# Patient Record
Sex: Female | Born: 1975 | Race: White | Hispanic: No | Marital: Married | Smoking: Never smoker
Health system: Southern US, Community
[De-identification: ages and names within clinical notes are randomized; demographics above are authoritative.]

## PROBLEM LIST (undated history)

## (undated) DIAGNOSIS — Z806 Family history of leukemia: Secondary | ICD-10-CM

## (undated) DIAGNOSIS — L729 Follicular cyst of the skin and subcutaneous tissue, unspecified: Secondary | ICD-10-CM

## (undated) DIAGNOSIS — Z85528 Personal history of other malignant neoplasm of kidney: Secondary | ICD-10-CM

## (undated) DIAGNOSIS — C801 Malignant (primary) neoplasm, unspecified: Secondary | ICD-10-CM

## (undated) DIAGNOSIS — R002 Palpitations: Secondary | ICD-10-CM

## (undated) DIAGNOSIS — Z8742 Personal history of other diseases of the female genital tract: Secondary | ICD-10-CM

## (undated) DIAGNOSIS — K219 Gastro-esophageal reflux disease without esophagitis: Secondary | ICD-10-CM

## (undated) DIAGNOSIS — D649 Anemia, unspecified: Secondary | ICD-10-CM

## (undated) DIAGNOSIS — E039 Hypothyroidism, unspecified: Secondary | ICD-10-CM

## (undated) DIAGNOSIS — Z800 Family history of malignant neoplasm of digestive organs: Secondary | ICD-10-CM

## (undated) DIAGNOSIS — R000 Tachycardia, unspecified: Secondary | ICD-10-CM

## (undated) DIAGNOSIS — B379 Candidiasis, unspecified: Secondary | ICD-10-CM

## (undated) DIAGNOSIS — Z9889 Other specified postprocedural states: Secondary | ICD-10-CM

## (undated) DIAGNOSIS — O09529 Supervision of elderly multigravida, unspecified trimester: Secondary | ICD-10-CM

## (undated) DIAGNOSIS — R112 Nausea with vomiting, unspecified: Secondary | ICD-10-CM

## (undated) HISTORY — PX: ABDOMINAL HYSTERECTOMY: SHX81

## (undated) HISTORY — DX: Supervision of elderly multigravida, unspecified trimester: O09.529

## (undated) HISTORY — PX: COLONOSCOPY: SHX174

## (undated) HISTORY — DX: Personal history of other malignant neoplasm of kidney: Z85.528

## (undated) HISTORY — DX: Family history of leukemia: Z80.6

## (undated) HISTORY — DX: Family history of malignant neoplasm of digestive organs: Z80.0

## (undated) HISTORY — PX: RENAL MASS EXCISION: SHX2324

## (undated) HISTORY — DX: Malignant (primary) neoplasm, unspecified: C80.1

---

## 1999-08-31 ENCOUNTER — Other Ambulatory Visit: Admission: RE | Admit: 1999-08-31 | Discharge: 1999-08-31 | Payer: Self-pay | Admitting: Obstetrics and Gynecology

## 1999-10-18 ENCOUNTER — Encounter: Payer: Self-pay | Admitting: Obstetrics and Gynecology

## 1999-10-18 ENCOUNTER — Ambulatory Visit (HOSPITAL_COMMUNITY): Admission: RE | Admit: 1999-10-18 | Discharge: 1999-10-18 | Payer: Self-pay | Admitting: Obstetrics and Gynecology

## 1999-11-19 ENCOUNTER — Ambulatory Visit (HOSPITAL_COMMUNITY): Admission: RE | Admit: 1999-11-19 | Discharge: 1999-11-19 | Payer: Self-pay | Admitting: Obstetrics and Gynecology

## 1999-11-19 ENCOUNTER — Encounter: Payer: Self-pay | Admitting: Obstetrics and Gynecology

## 2000-02-22 ENCOUNTER — Ambulatory Visit (HOSPITAL_COMMUNITY): Admission: RE | Admit: 2000-02-22 | Discharge: 2000-02-22 | Payer: Self-pay | Admitting: Obstetrics and Gynecology

## 2000-02-22 ENCOUNTER — Encounter: Payer: Self-pay | Admitting: Obstetrics and Gynecology

## 2000-03-13 ENCOUNTER — Inpatient Hospital Stay (HOSPITAL_COMMUNITY): Admission: AD | Admit: 2000-03-13 | Discharge: 2000-03-13 | Payer: Self-pay | Admitting: Obstetrics and Gynecology

## 2000-03-19 ENCOUNTER — Inpatient Hospital Stay (HOSPITAL_COMMUNITY): Admission: AD | Admit: 2000-03-19 | Discharge: 2000-03-21 | Payer: Self-pay | Admitting: Obstetrics and Gynecology

## 2000-06-23 ENCOUNTER — Emergency Department (HOSPITAL_COMMUNITY): Admission: EM | Admit: 2000-06-23 | Discharge: 2000-06-24 | Payer: Self-pay | Admitting: Emergency Medicine

## 2000-08-20 ENCOUNTER — Other Ambulatory Visit: Admission: RE | Admit: 2000-08-20 | Discharge: 2000-08-20 | Payer: Self-pay | Admitting: Obstetrics and Gynecology

## 2002-09-22 ENCOUNTER — Other Ambulatory Visit: Admission: RE | Admit: 2002-09-22 | Discharge: 2002-09-22 | Payer: Self-pay | Admitting: Obstetrics and Gynecology

## 2004-02-02 ENCOUNTER — Other Ambulatory Visit: Admission: RE | Admit: 2004-02-02 | Discharge: 2004-02-02 | Payer: Self-pay | Admitting: Obstetrics and Gynecology

## 2004-08-13 ENCOUNTER — Inpatient Hospital Stay (HOSPITAL_COMMUNITY): Admission: AD | Admit: 2004-08-13 | Discharge: 2004-08-13 | Payer: Self-pay | Admitting: Obstetrics and Gynecology

## 2005-02-27 ENCOUNTER — Emergency Department (HOSPITAL_COMMUNITY): Admission: EM | Admit: 2005-02-27 | Discharge: 2005-02-27 | Payer: Self-pay | Admitting: Emergency Medicine

## 2005-03-05 ENCOUNTER — Ambulatory Visit (HOSPITAL_COMMUNITY): Admission: RE | Admit: 2005-03-05 | Discharge: 2005-03-05 | Payer: Self-pay | Admitting: Internal Medicine

## 2005-12-22 ENCOUNTER — Encounter: Admission: RE | Admit: 2005-12-22 | Discharge: 2005-12-22 | Payer: Self-pay | Admitting: Family Medicine

## 2007-03-06 ENCOUNTER — Encounter: Admission: RE | Admit: 2007-03-06 | Discharge: 2007-03-06 | Payer: Self-pay | Admitting: Internal Medicine

## 2007-05-19 ENCOUNTER — Other Ambulatory Visit: Admission: RE | Admit: 2007-05-19 | Discharge: 2007-05-19 | Payer: Self-pay | Admitting: Interventional Radiology

## 2007-05-19 ENCOUNTER — Encounter (INDEPENDENT_AMBULATORY_CARE_PROVIDER_SITE_OTHER): Payer: Self-pay | Admitting: Interventional Radiology

## 2007-05-19 ENCOUNTER — Encounter: Admission: RE | Admit: 2007-05-19 | Discharge: 2007-05-19 | Payer: Self-pay | Admitting: Internal Medicine

## 2008-06-30 ENCOUNTER — Ambulatory Visit (HOSPITAL_COMMUNITY): Admission: RE | Admit: 2008-06-30 | Discharge: 2008-06-30 | Payer: Self-pay | Admitting: Obstetrics and Gynecology

## 2008-07-28 ENCOUNTER — Ambulatory Visit (HOSPITAL_COMMUNITY): Admission: RE | Admit: 2008-07-28 | Discharge: 2008-07-28 | Payer: Self-pay | Admitting: Obstetrics and Gynecology

## 2008-09-16 ENCOUNTER — Ambulatory Visit (HOSPITAL_COMMUNITY): Admission: RE | Admit: 2008-09-16 | Discharge: 2008-09-16 | Payer: Self-pay | Admitting: Obstetrics and Gynecology

## 2008-10-07 ENCOUNTER — Inpatient Hospital Stay (HOSPITAL_COMMUNITY): Admission: AD | Admit: 2008-10-07 | Discharge: 2008-10-10 | Payer: Self-pay | Admitting: Obstetrics and Gynecology

## 2008-10-07 ENCOUNTER — Encounter (INDEPENDENT_AMBULATORY_CARE_PROVIDER_SITE_OTHER): Payer: Self-pay | Admitting: Obstetrics and Gynecology

## 2011-01-06 ENCOUNTER — Encounter: Payer: Self-pay | Admitting: Internal Medicine

## 2011-04-30 NOTE — Discharge Summary (Signed)
NAMEZENDA, HERSKOWITZ                ACCOUNT NO.:  [id number]   MEDICAL RECORD NO.:  [id number]          PATIENT TYPE:  INP   LOCATION:  9304                          FACILITY:  [facility]   PHYSICIAN:  Sherron Monday, MD        DATE OF BIRTH:  12-26-1975   DATE OF ADMISSION:  10/07/2008  DATE OF DISCHARGE:  10/10/2008                               DISCHARGE SUMMARY   ADMITTING DIAGNOSES:  Intrauterine pregnancy at term, nonreassuring  fetal heart tracing.   DISCHARGE DIAGNOSES:  Intrauterine pregnancy at term, nonreassuring  fetal heart tracing, delivered via primary low transverse cesarean  section.   HISTORY OF PRESENT ILLNESS:  A 35 year old G2, P1-0-0-1 at 70 plus weeks  with decreased fetal movement, no loss of fluids, no vaginal bleeding,  and occasional contractions.  She initially had an NST in the office  that was somewhat nonreassuring.  She was taken to MAU where she had an  nonreassuring tracing there as well with repetitive late decelerations.  I discussed with the patient risks, benefits, and alternatives and the  decision was made to proceed with a primary low transverse cesarean  section.  The patient states she has had increased blood pressure, but  normal PIH labs and a 24-hour urine revealed 273 mg of protein,  otherwise uncomplicated prenatal care.   PAST MEDICAL HISTORY:  Significant for Hashimoto thyroiditis as well as  sinus tachycardia with normal echo.   PAST SURGICAL HISTORY:  Nonsignificant.   PAST OB/GYN HISTORY:  G1 is a term vaginal delivery of a female infant  weighing 7 pounds 6 ounces.  She is now 69-year-old and has type 1  diabetes.  G2 is the present pregnancy female infant, his name is Education administrator.  No history of any abnormal Pap smears or sexually transmitted diseases.   MEDICATIONS:  Prenatal vitamins, Synthroid, and Inderal.   ALLERGIES:  PENICILLIN, which causes rash.   SOCIAL HISTORY:  She denies alcohol, tobacco, or drug use.  She is  married and  works for hospice.   FAMILY HISTORY:  Significant for mother has passed away from colon  cancer.  Father with coronary artery disease, diabetes, and end-stage  renal disease.   PRENATAL LABS:  Hemoglobin 12.0 and platelets 271.  A positive.  Antibody screen negative.  Gonorrhea negative.  Chlamydia negative.  RPR  nonreactive.  Rubella immune.  Hepatitis C surface antigen negative.  HIV negative.  Glucola of 121.  Group B strep was negative.  First  trimester screen within normal limits.  AFB within normal limits.  Ultrasound recently revealed an AFI of 10, vertex presentation.  Estimated fetal weight of 2813 g or 6 pounds 3 ounces __________.   PHYSICAL EXAMINATION:  She is afebrile.  Vital signs are stable on  admission.  Blood pressure was 131/89, benign exam with the heart  tracing as above mentioned 140s, minimal variabilities, some  decelerations which on further monitoring were repetitive late  decelerations.  The patient did have a scope exam after her vaginal  exam.  Contractions were difficult to trace and it seemed to be  every 5-  10 minutes.  Vaginal exam was 3-4 cm dilated, 30% effaced, and -3  station.  She was admitted and proceeded with a low transverse cesarean  section after discussing risks, benefits, and alternatives.  Her surgery  went without difficulty, delivering a viable female infant at 14:43 with  Apgars of 7 at 1 minute and 9 at 5 minutes and weight of 5 pounds 14  ounces.  Placenta was expressed, intact at 1444, normal uterus, tubes,  and ovaries with an EBL of 700 mL.  Her postoperative course was  relatively uncomplicated.  She remained afebrile and vital signs stable  throughout.  Her hemoglobin decreased from 11.8 to 9.3.  She was  discharged to home on postoperative day #3 with routine discharge  instructions and numbers to call with any questions or problems.  At  this point, she had normal lochia, pain was well controlled.  She was  ambulating and  voiding without difficulty.  Her incision was clean, dry,  and intact.  She was discharged to home with routine discharge  instructions and numbers to call with any questions or problems as well  as prescriptions for Motrin, Vicodin, and prenatal vitamins.  She will  come to the office on Friday for a staple removal.  She is A positive.  Rubella immune.  She plans to get an Essure tubal ligation for  contraception in the postpartum course.  She is breastfeeding.  Hemoglobin decreased from 11.8 to 9.2.      Sherron Monday, MD  Electronically Signed     JB/MEDQ  D:  10/10/2008  T:  10/10/2008  Job:  [id number]

## 2011-04-30 NOTE — Op Note (Signed)
Kathleen Henry, Kathleen Henry                ACCOUNT NO.:  [id number]   MEDICAL RECORD NO.:  [id number]          PATIENT TYPE:  INP   LOCATION:  9304                          FACILITY:  [facility]   PHYSICIAN:  Sherron Monday, MD        DATE OF BIRTH:  [birth year]   DATE OF PROCEDURE:  10/07/2008  DATE OF DISCHARGE:                               OPERATIVE REPORT   PREOPERATIVE DIAGNOSES:  Intrauterine pregnancy at term, fetal stress,  fetal heart tones 140, minimal variability with repetitive late  decelerations.   POSTOPERATIVE DIAGNOSES:  Intrauterine pregnancy at term, fetal stress,  fetal heart tones 140, minimal variability with repetitive late  decelerations, delivered.   PROCEDURE:  Primary low-transverse cesarean section.   SURGEON:  Sherron Monday, MD.   ANESTHESIA:  Spinal.   FINDINGS:  Viable female infant at 14:43 with Apgars of 7 at 1 minute and  9 at 5 minutes and a weight of 5 pounds 14 ounces.  Placenta was  expressed intact at 14:44.  Normal uterus, tubes, and ovaries noted.   PATHOLOGY:  Placenta.   COMPLICATIONS:  None.   EBL:  700 mL.   IV FLUIDS:  1100 mL.   URINE OUTPUT:  300 mL.   DISPOSITION:  Stable to PACU following the procedure.   DESCRIPTION OF PROCEDURE:  After informed consent was reviewed with the  patient including risks, benefits, and alternatives of surgical  procedure and given her nonreassuring fetal heart tracing with  repetitive decelerations, she was transferred to the operating room.  Spinal anesthesia was placed.  She was then placed in supine position on  the table with a leftward tilt, prepped and draped in the normal sterile  fashion.  A Pfannenstiel skin incision was made at the level  approximately 2 fingerbreadths above pubic symphysis and carried through  to the underlying layer of fascia sharply.  The fascia was incised in  the midline.  The incision was extended laterally with Mayo scissors.  Superior aspect of the fascial incision was  grasped with Kocher clamps  and elevated.  Rectus muscles were dissected off both bluntly and  sharply.  Inferior aspect of the fascial incision was grasped with  Kocher clamps and elevated.  The rectus muscles were dissected off  bluntly.  Peritoneum was entered bluntly.  The Alexis wound retractor  was placed.  It was carefully checked to make sure no bowel was  entrapped.  The vesicouterine peritoneum was identified, tented up with  smooth pickups and the bladder flap was created with Metzenbaum scissors  and blunt dissection.  The uterus was incised in transverse fashion.  Infant was delivered from the vertex presentation and cord was clamped  and cut.  Mouth and nose were suctioned.  The infant was handed off to  the awaiting pediatric staff.  Cord gases were collected and sent  however an adequate for sample.  The placenta was inspected intact.  Uterus was cleared of all clots and debris.  The uterine incision was  closed in 2 layers of 0 Monocryl, first which was a running locked and  the second was an imbricating layer.  The pelvis was irrigated and  subfascial planes were inspected and found to be hemostatic.  The fascia  was closed with 0 Vicryl in a running fashion with subcuticular adipose  layer was made hemostatic with Bovie cautery was irrigated and  reapproximated using 2-0 plain gut sutures.  The skin was closed with  staples.  The patient tolerated the procedure well.  Sponge, lap, and  needle counts were correct x2 at the end of procedure per the operating  room staff.      Sherron Monday, MD  Electronically Signed     JB/MEDQ  D:  10/07/2008  T:  10/08/2008  Job:  [id number]

## 2011-05-03 NOTE — Discharge Summary (Signed)
[facility]  Patient:    Kathleen Henry, Kathleen Henry                         MRN: [id number] Adm. Date:  [id number] Disc. Date: [id number] Attending:  Oliver Pila                           Discharge Summary  00000DISCHARGE DIAGNOSES: 1. Term pregnancy at 40+ weeks delivered. 2. Status post normal spontaneous vaginal delivery.  DISCHARGE MEDICATIONS: Motrin 600 mg p.o. every 6 hours.  DISCHARGE FOLLOWUP:  Patient is to follow up in my office in approximately six weeks for her routine postpartum exam.  HOSPITAL COURSE:  Patient is a 35 year old G1,P0 who was admitted at 69 5/7 weeks for induction of labor given she is postdate favorable.  Pregnancy was uncomplicated except for a fundal height greater than date; however, a sonogram on 02/22/00 revealed a fetus that was 39th percentile and 2,881 grams.  Prenatal labs were as follows:  A+ antibody negative, RPR nonreactive, rubella ______, hepatitis B surface antigen negative, HIV negative, GC negative, chlamydia negative, and GPS negative.  PAST OB HISTORY:  None.  PAST GYN HISTORY:  None.  PAST MEDICAL HISTORY:  None.  PAST SURGICAL HISTORY:  None.  ALLERGIES: PENICILLIN.  MEDICATIONS:  Prenatal vitamins.  On admission, she was afebrile with stable vital signs.  Fundal height was 44 cm and ST was reactive.  Cervix was 2 cm dilated 50% effaced ______ station.  She ad assist ruptured membranes with clear fluid obtained and was begun on ______ augmentation. Patient progressed well and had a normal spontaneous vaginal delivery of a vigorous female infant over a second degree laceration.  Apgars were 8/9, weight was 7 pounds, 6 ounces.  Her second degree laceration was repaired with -0 and 2-0 Vicryl.  The placenta delivered spontaneously.  The EBL was 350cc.  She was admitted for routine postpartum care and did well.  She was discharged to home n postpartum day #2, afebrile with stable vital  signs and had normal lochia and her pain was well controlled with Motrin. DD:  03/21/00 TD:  03/23/00 Job: [id number]

## 2011-09-17 LAB — COMPREHENSIVE METABOLIC PANEL
ALT: 9
Albumin: 2.5 — ABNORMAL LOW
Alkaline Phosphatase: 122 — ABNORMAL HIGH
BUN: 7
Chloride: 103
Glucose, Bld: 67 — ABNORMAL LOW
Potassium: 4.5
Sodium: 133 — ABNORMAL LOW
Total Bilirubin: 1.1
Total Protein: 5.8 — ABNORMAL LOW

## 2011-09-17 LAB — CBC
HCT: 35.2 — ABNORMAL LOW
Hemoglobin: 9.3 — ABNORMAL LOW
MCHC: 33.5
MCHC: 34
MCV: 86.4
Platelets: 301
RBC: 3.17 — ABNORMAL LOW
RDW: 14.5
RDW: 15.1

## 2011-09-17 LAB — LACTATE DEHYDROGENASE: LDH: 239

## 2011-09-17 LAB — RPR: RPR Ser Ql: NONREACTIVE

## 2012-12-16 DIAGNOSIS — L729 Follicular cyst of the skin and subcutaneous tissue, unspecified: Secondary | ICD-10-CM

## 2012-12-16 HISTORY — DX: Follicular cyst of the skin and subcutaneous tissue, unspecified: L72.9

## 2014-11-08 LAB — OB RESULTS CONSOLE GC/CHLAMYDIA
Chlamydia: NEGATIVE
Gonorrhea: NEGATIVE

## 2014-11-08 LAB — OB RESULTS CONSOLE ABO/RH: RH TYPE: POSITIVE

## 2014-11-08 LAB — OB RESULTS CONSOLE HIV ANTIBODY (ROUTINE TESTING): HIV: NONREACTIVE

## 2014-11-08 LAB — OB RESULTS CONSOLE RPR: RPR: NONREACTIVE

## 2014-11-08 LAB — OB RESULTS CONSOLE RUBELLA ANTIBODY, IGM: Rubella: UNDETERMINED

## 2014-11-08 LAB — OB RESULTS CONSOLE HEPATITIS B SURFACE ANTIGEN: Hepatitis B Surface Ag: NEGATIVE

## 2014-11-08 LAB — OB RESULTS CONSOLE ANTIBODY SCREEN: ANTIBODY SCREEN: NEGATIVE

## 2014-12-16 NOTE — L&D Delivery Note (Signed)
VBAC  Delivery Note  SVD viable SVD Apgars 9,9 over 2nd degree ML lac with nuchal x 1 reduced.  Placenta delivered spontaneously intact with 3VC. Repair with 2-0 Chromic with good support and hemostasis noted and R/V exam confirms.  PH art was not done.  [place] cord blood was done.  Mother and baby were doing well.  EBL 100c  Louretta Shorten, MD

## 2014-12-27 ENCOUNTER — Other Ambulatory Visit (HOSPITAL_COMMUNITY): Payer: Self-pay | Admitting: Obstetrics and Gynecology

## 2014-12-27 DIAGNOSIS — Z0489 Encounter for examination and observation for other specified reasons: Secondary | ICD-10-CM

## 2014-12-27 DIAGNOSIS — O09522 Supervision of elderly multigravida, second trimester: Secondary | ICD-10-CM

## 2014-12-27 DIAGNOSIS — IMO0002 Reserved for concepts with insufficient information to code with codable children: Secondary | ICD-10-CM

## 2015-01-17 ENCOUNTER — Ambulatory Visit (HOSPITAL_COMMUNITY): Payer: Self-pay

## 2015-01-17 ENCOUNTER — Other Ambulatory Visit (HOSPITAL_COMMUNITY): Payer: Self-pay

## 2015-01-17 ENCOUNTER — Ambulatory Visit (HOSPITAL_COMMUNITY)
Admission: RE | Admit: 2015-01-17 | Discharge: 2015-01-17 | Disposition: A | Discharge status: Home / Self Care | Payer: 59 | Source: Ambulatory Visit | Attending: Obstetrics and Gynecology | Admitting: Obstetrics and Gynecology

## 2015-01-17 ENCOUNTER — Encounter (HOSPITAL_COMMUNITY): Payer: Self-pay

## 2015-01-17 ENCOUNTER — Other Ambulatory Visit (HOSPITAL_COMMUNITY): Payer: Self-pay | Admitting: Obstetrics and Gynecology

## 2015-01-17 DIAGNOSIS — IMO0002 Reserved for concepts with insufficient information to code with codable children: Secondary | ICD-10-CM

## 2015-01-17 DIAGNOSIS — O09522 Supervision of elderly multigravida, second trimester: Secondary | ICD-10-CM | POA: Insufficient documentation

## 2015-01-17 DIAGNOSIS — O99212 Obesity complicating pregnancy, second trimester: Secondary | ICD-10-CM | POA: Insufficient documentation

## 2015-01-17 DIAGNOSIS — Z3A19 19 weeks gestation of pregnancy: Secondary | ICD-10-CM | POA: Insufficient documentation

## 2015-01-17 DIAGNOSIS — Z315 Encounter for genetic counseling: Secondary | ICD-10-CM | POA: Diagnosis present

## 2015-01-17 DIAGNOSIS — O09529 Supervision of elderly multigravida, unspecified trimester: Secondary | ICD-10-CM | POA: Insufficient documentation

## 2015-01-17 DIAGNOSIS — Z0489 Encounter for examination and observation for other specified reasons: Secondary | ICD-10-CM

## 2015-01-17 DIAGNOSIS — Z3689 Encounter for other specified antenatal screening: Secondary | ICD-10-CM | POA: Insufficient documentation

## 2015-01-17 HISTORY — DX: Tachycardia, unspecified: R00.0

## 2015-01-17 HISTORY — DX: Hypothyroidism, unspecified: E03.9

## 2015-01-17 NOTE — Progress Notes (Signed)
Genetic Counseling  High-Risk Gestation Note  Appointment Date:  01/17/2015 Referred By: Kathleen Lex, MD Date of Birth:  [birth year] Partner:  Melba Henry   Pregnancy History: G3P2000 Estimated Date of Delivery: 06/12/15 Estimated Gestational Age: [redacted]w[redacted]d Attending: Renella Cunas, MD    Kathleen Henry and her husband, Kathleen Henry, were seen for genetic counseling because of a maternal age of 39 y.o.Marland Kitchen     They were counseled regarding maternal age and the association with risk for chromosome conditions due to nondisjunction with aging of the ova.  We reviewed chromosomes, nondisjunction, and the associated 1 in 48 risk for fetal aneuploidy related to a maternal age of 39 y.o. at [redacted]w[redacted]d gestation.  They were counseled that the risk for aneuploidy decreases as gestational age increases, accounting for those pregnancies which spontaneously abort.  We specifically discussed Down syndrome (trisomy 78), trisomies 32 and 58, and sex chromosome aneuploidies (47,XXX and 47,XXY) including the common features and prognoses of each.   Kathleen Henry previously had noninvasive prenatal screening (NIPS)/cell free DNA (cfDNA) testing (Panorama through [place]) drawn twice through her OB office in the first trimester. Both attempts yielded no results due to low fetal fraction in the maternal sample.   We reviewed available screening options for fetal aneuploidy including Quad screen, noninvasive prenatal screening (NIPS)/cell free DNA (cfDNA) testing, and detailed ultrasound.  They were counseled that screening tests are used to modify a patient's a priori risk for aneuploidy, typically based on age. This estimate provides a pregnancy specific risk assessment. We reviewed the benefits and limitations of each option. Specifically, we discussed the conditions for which each test screens, the detection rates, and false positive rates of each. We discussed that fetal fraction is expected to increase incrementally  throughout pregnancy and thus, a redraw may be able to yield a result if that fetal fraction has adequately increased. We discussed that based upon available information from [facility] laboratory, the likelihood of obtaining a result is estimated to be 63% in this case, which is based upon various factors including maternal weight and amount of fetal fraction in the previous sample. We reviewed that there are some pregnancies that do not have a sufficient amount of fetal fraction for NIPS to be performed, regardless of the gestational age, and we are not able to accurately predict which pregnancies may fall in this category. We also discussed that literature suggests an association with increased risk for aneuploidy in pregnancies with low fetal fraction. However, data are currently too limited to accurately adjust the pregnancy specific risk for aneuploidy in these cases.   Targeted ultrasound was performed today. We reviewed that visualized fetal anatomy appeared normal. However, limited views of fetal anatomy were obtained. Follow-up ultrasound was planned in four weeks to complete fetal anatomic survey. Complete ultrasound results reported separately. They were also counseled regarding diagnostic testing via amniocentesis. We reviewed the approximate 1 in 097-353 risk for complications for amniocentesis, including spontaneous pregnancy loss. After careful consideration, Kathleen Henry elected to proceed with a redraw for Panorama today and declined Quad screen. Kathleen Henry declined amniocentesis in the pregnancy. They understand that screening tests cannot rule out all birth defects or genetic syndromes. The patient was advised of this limitation and states she still does not want additional testing at this time.   Kathleen Henry was provided with written information regarding cystic fibrosis (CF) including the carrier frequency and incidence in the Caucasian population, the availability of carrier testing and  prenatal diagnosis  if indicated. Kathleen Henry reported a "third or fourth cousin" with cystic fibrosis. He was unsure of the exact degree of relation. We discussed that in the case of a third cousin with CF, the degree of relation would not increase his risk to be a CF carrier above the general population risk.  In addition, we discussed that CF is routinely screened for as part of the [place] newborn screening panel.  She declined testing today.   Both family histories were reviewed and found to be contributory for type I diabetes mellitus for the couple's daughter. She is currently 57 years old and diagnosed at age 70 years old.  Kathleen Henry also reported a maternal uncle and a maternal great-uncle with type I diabetes. Diabetes mellitus type I is thought to be due to multifactorial inheritance, with onset due to a combination genetic and environmental factors. The chance for type I diabetes in siblings of an isolated case is approximately 5%. They understand that prenatal screening or testing is not available for diabetes mellitus in the baby at this time.  It would be important for the couple's pediatrician to be aware of this family history so that their children can be followed and screened appropriately.  Kathleen Henry reported a female paternal first cousin with spina bifida. She reported that he does not have effects from this and to her knowledge, has not required surgery.  She reported that he possibly has closed spina bifida. Spina bifida occulta, or closed spina bifida, often describes an absence of one or two vertebral arches, without a visible lesion, which may be discovered incidentally following an X-ray for backache or other unrelated symptoms. Some forms of spina bifida occulta can cause symptoms, such as the presence of a tethered cord. Limited information exists regarding the prevalence and underlying cause of spina bifida occulta. Studies suggest that in cases of asymptomatic spina bifida occulta,  recurrence risk to relatives is not likely increased. In the case of symptomatic spina bifida occulta, recurrence risk for spina bifida may be similar to that of open neural tube defects. Open neural tube defects occur as an isolated finding, in the majority of cases and are typically suspected to follow multifactorial inheritance. Open neural tube defects may also occur as a feature of an underlying genetic syndrome or condition.  We discussed that the reported family history is more likely consistent with a form of closed spina bifida, in which case recurrence risk would not be expected to be increased above the general population risk for the current pregnancy. We discussed that prenatal screening does not assess for closed spina bifida. In the case of a form of open spina bifida, recurrence risk for the current pregnancy would also not likely be increased above the general population risk given the degree of relation and in the case of multifactorial inheritance. Additionally, we reviewed that Ms. Henry previously had maternal serum AFP screening performed, which was within normal range.   Kathleen Henry also reported a female maternal first cousin once removed (her paternal aunt's son's son) with intellectual disability. He was described to have dysmorphic features, specifically his head shape. He is currently 41 years old. A specific underlying cause is not known for his features by the couple. The couple were counseled that there are many different causes of intellectual disabilities including environmental, multifactorial, and genetic etiologies.  We discussed that a specific diagnosis for intellectual disability can be determined in approximately 50% of these individuals.  In the remaining 50% of individuals,  a diagnosis may never be determined.  Regarding genetic causes, we discussed that chromosome aberrations (aneuploidy, deletions, duplications, insertions, and translocations) are responsible for a small  percentage of individuals with intellectual disability.  Many individuals with chromosome aberrations have additional differences, including congenital anomalies or minor dysmorphisms.  Likewise, single gene conditions are the underlying cause of intellectual delay in some families.  We discussed that many gene conditions have intellectual disability as a feature, but also often include other physical or medical differences. We discussed that without more specific information, it is difficult to provide an accurate risk assessment.  Further genetic counseling is warranted if more information is obtained.  Kathleen Henry reported a female paternal first cousin with cerebral palsy.  Cerebral palsy (CP) is a group of clinical syndromes that range in severity, characterized by abnormal muscle tone, posture, and movement. Cerebral palsy is due to abnormalities in the developing brain resulting from a variety of causes. The etiology is reported to be multifactorial, with most cases typically due to prenatal factors. Prematurity is the most common association, but in many cases, no cause is identified. A specific genetic cause for cerebral palsy has not been identified, and underlying genetic disorders are relatively uncommon in individuals with CP.  Given the reported family history, recurrence risk for the current pregnancy would not likely be increased. Additional information regarding this individual's underlying condition or etiology may alter recurrence risk assessment. Without further information regarding the provided family history, an accurate genetic risk cannot be calculated. Further genetic counseling is warranted if more information is obtained.  Kathleen Henry denied exposure to environmental toxins or chemical agents. She denied the use of alcohol, tobacco or street drugs. She denied significant viral illnesses during the course of her pregnancy. Her medical and surgical histories were contributory for  sinus tachycardia and Hashimoto's thyroiditis, and she is taking medication regarding this history.   I counseled this couple regarding the above risks and available options.  The approximate face-to-face time with the genetic counselor was 45 minutes.  Kathleen Oman, MS,  Certified Genetic Counselor 01/17/2015

## 2015-01-24 ENCOUNTER — Telehealth (HOSPITAL_COMMUNITY): Payer: Self-pay | Admitting: MS"

## 2015-01-24 NOTE — Telephone Encounter (Signed)
Left message for patient to return call.   Santiago Glad Aurther Harlin 01/24/2015 9:19 AM

## 2015-01-24 NOTE — Telephone Encounter (Signed)
Called Kathleen Henry to discuss her cell free fetal DNA test results.  Kathleen Henry had Panorama testing through [facility].  Testing was offered because of advanced maternal age.   The patient was identified by name and DOB.  We reviewed that these are within normal limits, showing a less than 1 in 10,000 risk for trisomies 21, 18 and 13, and monosomy Henry (Turner syndrome).  In addition, the risk for triploidy/vanishing twin and sex chromosome trisomies (47,XXX and 47,XXY) was also low risk.  Kathleen Henry elected to have cffDNA analysis for 22q11 deletion syndrome, which was also low risk (<1 in 3330).  We reviewed that this testing identifies > 99% of pregnancies with trisomy 3, trisomy 10, sex chromosome trisomies (47,XXX and 47,XXY), and triploidy. The detection rate for trisomy 18 is 96%.  The detection rate for monosomy Henry is ~92%.  The false positive rate is <0.1% for all conditions. Testing was also consistent with female fetal sex.  The patient did wish to know fetal sex.  She understands that this testing does not identify all genetic conditions.  All questions were answered to her satisfaction, she was encouraged to call with additional questions or concerns.  Chipper Oman, MS Certified Genetic Counselor 01/24/2015 9:25 AM

## 2015-01-25 ENCOUNTER — Other Ambulatory Visit (HOSPITAL_COMMUNITY): Payer: Self-pay

## 2015-02-14 ENCOUNTER — Other Ambulatory Visit (HOSPITAL_COMMUNITY): Payer: Self-pay | Admitting: Obstetrics and Gynecology

## 2015-02-14 ENCOUNTER — Other Ambulatory Visit (HOSPITAL_COMMUNITY): Payer: Self-pay | Admitting: Maternal and Fetal Medicine

## 2015-02-14 ENCOUNTER — Ambulatory Visit (HOSPITAL_COMMUNITY)
Admission: RE | Admit: 2015-02-14 | Discharge: 2015-02-14 | Disposition: A | Discharge status: Home / Self Care | Payer: 59 | Source: Ambulatory Visit | Attending: Obstetrics and Gynecology | Admitting: Obstetrics and Gynecology

## 2015-02-14 ENCOUNTER — Encounter (HOSPITAL_COMMUNITY): Payer: Self-pay

## 2015-02-14 DIAGNOSIS — Z0489 Encounter for examination and observation for other specified reasons: Secondary | ICD-10-CM | POA: Insufficient documentation

## 2015-02-14 DIAGNOSIS — O99210 Obesity complicating pregnancy, unspecified trimester: Secondary | ICD-10-CM

## 2015-02-14 DIAGNOSIS — Z3A23 23 weeks gestation of pregnancy: Secondary | ICD-10-CM | POA: Diagnosis not present

## 2015-02-14 DIAGNOSIS — O09522 Supervision of elderly multigravida, second trimester: Secondary | ICD-10-CM

## 2015-02-14 DIAGNOSIS — O99282 Endocrine, nutritional and metabolic diseases complicating pregnancy, second trimester: Secondary | ICD-10-CM | POA: Insufficient documentation

## 2015-02-14 DIAGNOSIS — O3421 Maternal care for scar from previous cesarean delivery: Secondary | ICD-10-CM | POA: Diagnosis not present

## 2015-02-14 DIAGNOSIS — O99212 Obesity complicating pregnancy, second trimester: Secondary | ICD-10-CM | POA: Diagnosis not present

## 2015-02-14 DIAGNOSIS — E039 Hypothyroidism, unspecified: Secondary | ICD-10-CM | POA: Diagnosis not present

## 2015-02-14 DIAGNOSIS — IMO0002 Reserved for concepts with insufficient information to code with codable children: Secondary | ICD-10-CM

## 2015-02-14 DIAGNOSIS — Z36 Encounter for antenatal screening of mother: Secondary | ICD-10-CM | POA: Insufficient documentation

## 2015-03-29 ENCOUNTER — Ambulatory Visit (HOSPITAL_COMMUNITY): Payer: 59

## 2015-06-08 ENCOUNTER — Encounter (HOSPITAL_COMMUNITY): Payer: Self-pay | Admitting: Anesthesiology

## 2015-06-08 NOTE — Anesthesia Preprocedure Evaluation (Deleted)
Anesthesia Evaluation  Patient identified by MRN, date of birth, ID band Patient awake    Reviewed: Allergy & Precautions, NPO status , Patient's Chart, lab work & pertinent test results, reviewed documented beta blocker date and time   Airway        Dental   Pulmonary neg pulmonary ROS,          Cardiovascular negative cardio ROS      Neuro/Psych negative neurological ROS     GI/Hepatic   Endo/Other  Hypothyroidism (replacement) Morbid obesity  Renal/GU      Musculoskeletal   Abdominal   Peds  Hematology   Anesthesia Other Findings   Reproductive/Obstetrics                            Anesthesia Physical Anesthesia Plan  ASA: II  Anesthesia Plan: Spinal   Post-op Pain Management:    Induction:   Airway Management Planned: Natural Airway  Additional Equipment:   Intra-op Plan:   Post-operative Plan:   Informed Consent: I have reviewed the patients History and Physical, chart, labs and discussed the procedure including the risks, benefits and alternatives for the proposed anesthesia with the patient or authorized representative who has indicated his/her understanding and acceptance.     Plan Discussed with:   Anesthesia Plan Comments: (Check am labs)        Anesthesia Quick Evaluation

## 2015-06-09 ENCOUNTER — Telehealth (HOSPITAL_COMMUNITY): Payer: Self-pay | Admitting: *Deleted

## 2015-06-09 ENCOUNTER — Encounter (HOSPITAL_COMMUNITY): Payer: Self-pay | Admitting: *Deleted

## 2015-06-09 LAB — OB RESULTS CONSOLE GBS: STREP GROUP B AG: POSITIVE

## 2015-06-09 NOTE — Telephone Encounter (Signed)
Preadmission screen  

## 2015-06-13 ENCOUNTER — Inpatient Hospital Stay (HOSPITAL_COMMUNITY): Admission: RE | Admit: 2015-06-13 | Payer: 59 | Source: Ambulatory Visit

## 2015-06-14 ENCOUNTER — Inpatient Hospital Stay (HOSPITAL_COMMUNITY): Payer: 59 | Admitting: Anesthesiology

## 2015-06-14 ENCOUNTER — Inpatient Hospital Stay (HOSPITAL_COMMUNITY)
Admission: RE | Admit: 2015-06-14 | Discharge: 2015-06-16 | DRG: 775 | Disposition: A | Discharge status: Home / Self Care | Payer: 59 | Source: Ambulatory Visit | Attending: Obstetrics and Gynecology | Admitting: Obstetrics and Gynecology

## 2015-06-14 ENCOUNTER — Inpatient Hospital Stay (HOSPITAL_COMMUNITY)
Admission: AD | Admit: 2015-06-14 | Disposition: A | Discharge status: Home / Self Care | Payer: 59 | Source: Ambulatory Visit | Admitting: Obstetrics and Gynecology

## 2015-06-14 ENCOUNTER — Encounter (HOSPITAL_COMMUNITY): Payer: Self-pay

## 2015-06-14 DIAGNOSIS — O09523 Supervision of elderly multigravida, third trimester: Secondary | ICD-10-CM

## 2015-06-14 DIAGNOSIS — O99824 Streptococcus B carrier state complicating childbirth: Secondary | ICD-10-CM

## 2015-06-14 DIAGNOSIS — O99214 Obesity complicating childbirth: Secondary | ICD-10-CM

## 2015-06-14 DIAGNOSIS — Z8249 Family history of ischemic heart disease and other diseases of the circulatory system: Secondary | ICD-10-CM

## 2015-06-14 DIAGNOSIS — Z3A40 40 weeks gestation of pregnancy: Secondary | ICD-10-CM | POA: Diagnosis present

## 2015-06-14 DIAGNOSIS — O480 Post-term pregnancy: Principal | ICD-10-CM | POA: Diagnosis present

## 2015-06-14 DIAGNOSIS — Z6841 Body Mass Index (BMI) 40.0 and over, adult: Secondary | ICD-10-CM

## 2015-06-14 DIAGNOSIS — O3421 Maternal care for scar from previous cesarean delivery: Secondary | ICD-10-CM | POA: Diagnosis present

## 2015-06-14 DIAGNOSIS — Z3490 Encounter for supervision of normal pregnancy, unspecified, unspecified trimester: Secondary | ICD-10-CM

## 2015-06-14 HISTORY — DX: Personal history of other diseases of the female genital tract: Z87.42

## 2015-06-14 HISTORY — DX: Follicular cyst of the skin and subcutaneous tissue, unspecified: L72.9

## 2015-06-14 LAB — TYPE AND SCREEN
ABO/RH(D): A POS
Antibody Screen: NEGATIVE

## 2015-06-14 LAB — CBC
HEMATOCRIT: 34 % — AB (ref 36.0–46.0)
HEMOGLOBIN: 11.4 g/dL — AB (ref 12.0–15.0)
MCH: 28.6 pg (ref 26.0–34.0)
MCHC: 33.5 g/dL (ref 30.0–36.0)
MCV: 85.2 fL (ref 78.0–100.0)
Platelets: 247 10*3/uL (ref 150–400)
RBC: 3.99 MIL/uL (ref 3.87–5.11)
RDW: 14.7 % (ref 11.5–15.5)
WBC: 8.5 10*3/uL (ref 4.0–10.5)

## 2015-06-14 LAB — ABO/RH: ABO/RH(D): A POS

## 2015-06-14 LAB — RPR: RPR Ser Ql: NONREACTIVE

## 2015-06-14 SURGERY — Surgical Case
Anesthesia: Regional

## 2015-06-14 MED ORDER — SENNOSIDES-DOCUSATE SODIUM 8.6-50 MG PO TABS
2.0000 | ORAL_TABLET | ORAL | Status: DC
  Administered 2015-06-14 – 2015-06-16 (×2): 2 via ORAL
  Filled 2015-06-14 (×2): qty 2

## 2015-06-14 MED ORDER — OXYTOCIN 40 UNITS IN LACTATED RINGERS INFUSION - SIMPLE MED
1.0000 m[IU]/min | INTRAVENOUS | Status: DC
  Administered 2015-06-14: 4 m[IU]/min via INTRAVENOUS
  Administered 2015-06-14: 1 m[IU]/min via INTRAVENOUS
  Filled 2015-06-14: qty 1000

## 2015-06-14 MED ORDER — DIBUCAINE 1 % RE OINT: 1.0000 "application " | TOPICAL_OINTMENT | RECTAL | Status: DC | PRN

## 2015-06-14 MED ORDER — LIDOCAINE HCL (PF) 1 % IJ SOLN
30.0000 mL | INTRAMUSCULAR | Status: DC | PRN
  Filled 2015-06-14: qty 30

## 2015-06-14 MED ORDER — FLEET ENEMA 7-19 GM/118ML RE ENEM: 1.0000 | ENEMA | RECTAL | Status: DC | PRN

## 2015-06-14 MED ORDER — PROPRANOLOL HCL 40 MG PO TABS: 50.0000 mg | ORAL_TABLET | Freq: Four times a day (QID) | ORAL | Status: DC

## 2015-06-14 MED ORDER — PHENYLEPHRINE 40 MCG/ML (10ML) SYRINGE FOR IV PUSH (FOR BLOOD PRESSURE SUPPORT)
PREFILLED_SYRINGE | INTRAVENOUS | Status: AC
  Filled 2015-06-14: qty 20

## 2015-06-14 MED ORDER — DIPHENHYDRAMINE HCL 50 MG/ML IJ SOLN: 12.5000 mg | INTRAMUSCULAR | Status: DC | PRN

## 2015-06-14 MED ORDER — BUTORPHANOL TARTRATE 1 MG/ML IJ SOLN: 1.0000 mg | INTRAMUSCULAR | Status: DC | PRN

## 2015-06-14 MED ORDER — OXYCODONE-ACETAMINOPHEN 5-325 MG PO TABS
1.0000 | ORAL_TABLET | ORAL | Status: DC | PRN
Start: 2015-06-14 — End: 2015-06-14

## 2015-06-14 MED ORDER — OXYTOCIN 40 UNITS IN LACTATED RINGERS INFUSION - SIMPLE MED: 62.5000 mL/h | INTRAVENOUS | Status: DC

## 2015-06-14 MED ORDER — WITCH HAZEL-GLYCERIN EX PADS: 1.0000 "application " | MEDICATED_PAD | CUTANEOUS | Status: DC | PRN

## 2015-06-14 MED ORDER — LACTATED RINGERS IV SOLN
INTRAVENOUS | Status: DC
  Administered 2015-06-14 (×2): via INTRAVENOUS

## 2015-06-14 MED ORDER — OXYCODONE-ACETAMINOPHEN 5-325 MG PO TABS: 2.0000 | ORAL_TABLET | ORAL | Status: DC | PRN

## 2015-06-14 MED ORDER — ACETAMINOPHEN 325 MG PO TABS: 650.0000 mg | ORAL_TABLET | ORAL | Status: DC | PRN

## 2015-06-14 MED ORDER — PROPRANOLOL HCL 10 MG PO TABS
10.0000 mg | ORAL_TABLET | Freq: Two times a day (BID) | ORAL | Status: DC
  Filled 2015-06-14 (×2): qty 1

## 2015-06-14 MED ORDER — PHENYLEPHRINE 40 MCG/ML (10ML) SYRINGE FOR IV PUSH (FOR BLOOD PRESSURE SUPPORT): 80.0000 ug | PREFILLED_SYRINGE | INTRAVENOUS | Status: DC | PRN

## 2015-06-14 MED ORDER — ZOLPIDEM TARTRATE 5 MG PO TABS: 5.0000 mg | ORAL_TABLET | Freq: Every evening | ORAL | Status: DC | PRN

## 2015-06-14 MED ORDER — FENTANYL 2.5 MCG/ML BUPIVACAINE 1/10 % EPIDURAL INFUSION (WH - ANES)
14.0000 mL/h | INTRAMUSCULAR | Status: DC | PRN
  Administered 2015-06-14: 14 mL/h via EPIDURAL

## 2015-06-14 MED ORDER — PROPRANOLOL HCL 20 MG/5ML PO SOLN
10.0000 mg | Freq: Two times a day (BID) | ORAL | Status: DC
  Administered 2015-06-14: 10 mg via ORAL
  Filled 2015-06-14 (×3): qty 2.5

## 2015-06-14 MED ORDER — BENZOCAINE-MENTHOL 20-0.5 % EX AERO
1.0000 "application " | INHALATION_SPRAY | CUTANEOUS | Status: DC | PRN
  Administered 2015-06-14: 1 via TOPICAL
  Filled 2015-06-14 (×2): qty 56

## 2015-06-14 MED ORDER — MEDROXYPROGESTERONE ACETATE 150 MG/ML IM SUSP
150.0000 mg | INTRAMUSCULAR | Status: DC | PRN
Start: 2015-06-14 — End: 2015-06-16

## 2015-06-14 MED ORDER — PROPRANOLOL HCL 10 MG PO TABS
10.0000 mg | ORAL_TABLET | Freq: Two times a day (BID) | ORAL | Status: DC
  Filled 2015-06-14: qty 1

## 2015-06-14 MED ORDER — CITRIC ACID-SODIUM CITRATE 334-500 MG/5ML PO SOLN: 30.0000 mL | ORAL | Status: DC | PRN

## 2015-06-14 MED ORDER — PRENATAL MULTIVITAMIN CH
1.0000 | ORAL_TABLET | Freq: Every day | ORAL | Status: DC
  Administered 2015-06-15: 1 via ORAL
  Filled 2015-06-14: qty 1

## 2015-06-14 MED ORDER — MEASLES, MUMPS & RUBELLA VAC ~~LOC~~ INJ: 0.5000 mL | INJECTION | Freq: Once | SUBCUTANEOUS | Status: DC

## 2015-06-14 MED ORDER — OXYCODONE-ACETAMINOPHEN 5-325 MG PO TABS: 1.0000 | ORAL_TABLET | ORAL | Status: DC | PRN

## 2015-06-14 MED ORDER — LEVOTHYROXINE SODIUM 150 MCG PO TABS
150.0000 ug | ORAL_TABLET | Freq: Every day | ORAL | Status: DC
  Administered 2015-06-15 – 2015-06-16 (×2): 150 ug via ORAL
  Filled 2015-06-14 (×2): qty 1

## 2015-06-14 MED ORDER — LEVOTHYROXINE SODIUM 150 MCG PO TABS
150.0000 ug | ORAL_TABLET | Freq: Every day | ORAL | Status: DC
  Administered 2015-06-14: 150 ug via ORAL
  Filled 2015-06-14 (×2): qty 1

## 2015-06-14 MED ORDER — ACETAMINOPHEN 325 MG PO TABS
650.0000 mg | ORAL_TABLET | ORAL | Status: DC | PRN
Start: 2015-06-14 — End: 2015-06-14

## 2015-06-14 MED ORDER — FENTANYL 2.5 MCG/ML BUPIVACAINE 1/10 % EPIDURAL INFUSION (WH - ANES)
INTRAMUSCULAR | Status: AC
  Filled 2015-06-14: qty 125

## 2015-06-14 MED ORDER — SIMETHICONE 80 MG PO CHEW: 80.0000 mg | CHEWABLE_TABLET | ORAL | Status: DC | PRN

## 2015-06-14 MED ORDER — CLINDAMYCIN PHOSPHATE 900 MG/50ML IV SOLN
900.0000 mg | Freq: Three times a day (TID) | INTRAVENOUS | Status: DC
  Administered 2015-06-14 (×2): 900 mg via INTRAVENOUS
  Filled 2015-06-14 (×3): qty 50

## 2015-06-14 MED ORDER — IBUPROFEN 600 MG PO TABS
600.0000 mg | ORAL_TABLET | Freq: Four times a day (QID) | ORAL | Status: DC
  Administered 2015-06-14 – 2015-06-16 (×7): 600 mg via ORAL
  Filled 2015-06-14 (×7): qty 1

## 2015-06-14 MED ORDER — OXYTOCIN BOLUS FROM INFUSION: 500.0000 mL | INTRAVENOUS | Status: DC

## 2015-06-14 MED ORDER — ONDANSETRON HCL 4 MG/2ML IJ SOLN: 4.0000 mg | INTRAMUSCULAR | Status: DC | PRN

## 2015-06-14 MED ORDER — ONDANSETRON HCL 4 MG/2ML IJ SOLN: 4.0000 mg | Freq: Four times a day (QID) | INTRAMUSCULAR | Status: DC | PRN

## 2015-06-14 MED ORDER — FENTANYL 2.5 MCG/ML BUPIVACAINE 1/10 % EPIDURAL INFUSION (WH - ANES)
INTRAMUSCULAR | Status: DC | PRN
  Administered 2015-06-14: 14 mL/h via EPIDURAL

## 2015-06-14 MED ORDER — LIDOCAINE HCL (PF) 1 % IJ SOLN
INTRAMUSCULAR | Status: DC | PRN
  Administered 2015-06-14: 10 mL

## 2015-06-14 MED ORDER — EPHEDRINE 5 MG/ML INJ: 10.0000 mg | INTRAVENOUS | Status: DC | PRN

## 2015-06-14 MED ORDER — PROPRANOLOL HCL 10 MG PO TABS
5.0000 mg | ORAL_TABLET | Freq: Four times a day (QID) | ORAL | Status: DC
  Administered 2015-06-14 (×2): 5 mg via ORAL
  Administered 2015-06-15: 06:00:00 via ORAL
  Administered 2015-06-15: 5 mg via ORAL
  Filled 2015-06-14 (×8): qty 0.5

## 2015-06-14 MED ORDER — ONDANSETRON HCL 4 MG PO TABS: 4.0000 mg | ORAL_TABLET | ORAL | Status: DC | PRN

## 2015-06-14 MED ORDER — LACTATED RINGERS IV SOLN: 500.0000 mL | INTRAVENOUS | Status: DC | PRN

## 2015-06-14 MED ORDER — ZOLPIDEM TARTRATE 5 MG PO TABS
5.0000 mg | ORAL_TABLET | Freq: Every evening | ORAL | Status: DC | PRN
Start: 2015-06-14 — End: 2015-06-14

## 2015-06-14 MED ORDER — LANOLIN HYDROUS EX OINT
TOPICAL_OINTMENT | CUTANEOUS | Status: DC | PRN
Start: 2015-06-14 — End: 2015-06-16

## 2015-06-14 MED ORDER — TETANUS-DIPHTH-ACELL PERTUSSIS 5-2.5-18.5 LF-MCG/0.5 IM SUSP: 0.5000 mL | Freq: Once | INTRAMUSCULAR | Status: DC

## 2015-06-14 MED ORDER — TERBUTALINE SULFATE 1 MG/ML IJ SOLN: 0.2500 mg | Freq: Once | INTRAMUSCULAR | Status: DC | PRN

## 2015-06-14 MED ORDER — DIPHENHYDRAMINE HCL 25 MG PO CAPS: 25.0000 mg | ORAL_CAPSULE | Freq: Four times a day (QID) | ORAL | Status: DC | PRN

## 2015-06-14 NOTE — Anesthesia Preprocedure Evaluation (Signed)
Anesthesia Evaluation  Patient identified by MRN, date of birth, ID band Patient awake and Patient confused    Reviewed: Allergy & Precautions, H&P , NPO status , Patient's Chart, lab work & pertinent test results  Airway Mallampati: II   Neck ROM: full    Dental   Pulmonary  breath sounds clear to auscultation  Pulmonary exam normal       Cardiovascular Exercise Tolerance: Good Normal cardiovascular examRhythm:regular Rate:Normal     Neuro/Psych    GI/Hepatic   Endo/Other  Hypothyroidism Morbid obesity  Renal/GU      Musculoskeletal   Abdominal   Peds  Hematology   Anesthesia Other Findings   Reproductive/Obstetrics (+) Pregnancy                             Anesthesia Physical Anesthesia Plan  ASA: III  Anesthesia Plan:    Post-op Pain Management:    Induction:   Airway Management Planned:   Additional Equipment:   Intra-op Plan:   Post-operative Plan:   Informed Consent: I have reviewed the patients History and Physical, chart, labs and discussed the procedure including the risks, benefits and alternatives for the proposed anesthesia with the patient or authorized representative who has indicated his/her understanding and acceptance.     Plan Discussed with:   Anesthesia Plan Comments:         Anesthesia Quick Evaluation

## 2015-06-14 NOTE — Anesthesia Procedure Notes (Signed)
Epidural Patient location during procedure: OB Start time: 06/14/2015 9:02 AM End time: 06/14/2015 9:20 AM  Staffing Anesthesiologist: Alexis Frock  Preanesthetic Checklist Completed: patient identified, site marked, surgical consent, pre-op evaluation, timeout performed, IV checked, risks and benefits discussed and monitors and equipment checked  Epidural Patient position: sitting Prep: site prepped and draped and DuraPrep Patient monitoring: heart rate, continuous pulse ox and blood pressure Approach: midline Location: L2-L3 Injection technique: LOR air  Needle:  Needle type: Tuohy  Needle gauge: 18 G Needle length: 9 cm and 9 Needle insertion depth: 8 cm Catheter type: closed end flexible Catheter size: 19 Gauge Catheter at skin depth: 15 cm Test dose: negative  Assessment Events: blood not aspirated, injection not painful, no injection resistance, negative IV test and no paresthesia  Additional Notes   Patient tolerated the insertion well without complications.Reason for block:procedure for pain

## 2015-06-14 NOTE — H&P (Signed)
Kathleen Henry is a 39 y.o. female presenting for IOL due to postdates, prev C/S desires VBAC.  GBS +.  AMA with normal level II [place] and NIPT. History OB History    Gravida Para Term Preterm AB TAB SAB Ectopic Multiple Living   3 2 2       2      Past Medical History  Diagnosis Date  . Hypothyroidism   . Sinus tachycardia   . AMA (advanced maternal age) multigravida 35+    Past Surgical History  Procedure Laterality Date  . Cesarean section     Family History: family history includes CAD in her father; Cancer in her mother, paternal grandfather, and paternal grandmother; Diabetes in her daughter and father; Hypertension in her father and mother; Kidney disease in her father; Thyroid disease in her father. Social History:  reports that she has never smoked. She has never used smokeless tobacco. She reports that she does not drink alcohol or use illicit drugs.   Prenatal Transfer Tool  Maternal Diabetes: No Genetic Screening: Normal Maternal Ultrasounds/Referrals: Normal Fetal Ultrasounds or other Referrals:  None Maternal Substance Abuse:  No Significant Maternal Medications:  None Significant Maternal Lab Results:  None Other Comments:  None  ROS  Dilation: 2 Effacement (%): 50 Station: -2 Exam by:: Kathleen Igo, RN Blood pressure 135/87, pulse 91, temperature 99.1 F (37.3 C), temperature source Oral, resp. rate 18, height 5\' 7"  (1.702 m), weight 350 lb (158.759 kg), last menstrual period 09/05/2014. Exam Physical Exam  Prenatal labs: ABO, Rh: --/--/A POS (06/29 0450) Antibody: NEG (06/29 0450) Rubella: Equivocal (11/24 0000) RPR: Nonreactive (11/24 0000)  HBsAg: Negative (11/24 0000)  HIV: Non-reactive (11/24 0000)  GBS: Positive (06/24 0000)   Assessment/Plan: IUP at 40 + weeks Desires IOL v C/S and risks and benefits of VBAC/TOL discussed informed consent obtained. Abx for GBS    Kathleen Henry C 06/14/2015, 8:06 AM

## 2015-06-15 ENCOUNTER — Encounter (HOSPITAL_COMMUNITY): Payer: Self-pay

## 2015-06-15 LAB — CCBB MATERNAL DONOR DRAW

## 2015-06-15 LAB — CBC
HEMATOCRIT: 32.4 % — AB (ref 36.0–46.0)
Hemoglobin: 10.7 g/dL — ABNORMAL LOW (ref 12.0–15.0)
MCH: 28.8 pg (ref 26.0–34.0)
MCHC: 33 g/dL (ref 30.0–36.0)
MCV: 87.1 fL (ref 78.0–100.0)
PLATELETS: 262 10*3/uL (ref 150–400)
RBC: 3.72 MIL/uL — AB (ref 3.87–5.11)
RDW: 15.1 % (ref 11.5–15.5)
WBC: 10.2 10*3/uL (ref 4.0–10.5)

## 2015-06-15 NOTE — Progress Notes (Addendum)
Spoke with Dr. Lynnette Caffey regarding pts BP of 96/68 when she normally runs upper 130s/90s.  Pt states that she "has felt weird over the last hour, but not dizzy."  States she felt a clot come out and I assessed her to find a grapefruit size clot.  Upon fundal check uterus was firm at umbilicus with no bleeding upon massage.  Dr. Lynnette Caffey instructed for pt to push fluids and recheck BP in an hour. Orders received to hold 1800 dose of propranolol 5 mg.

## 2015-06-15 NOTE — Anesthesia Postprocedure Evaluation (Signed)
  Anesthesia Post-op Note  Patient: Kathleen Henry  Procedure(s) Performed: * No procedures listed *  Patient Location: Mother/Baby  Anesthesia Type:Epidural  Level of Consciousness: awake  Airway and Oxygen Therapy: Patient Spontanous Breathing  Post-op Pain: mild  Post-op Assessment: Patient's Cardiovascular Status Stable and Respiratory Function Stable              Post-op Vital Signs: stable  Last Vitals:  Filed Vitals:   06/15/15 0615  BP: 140/93  Pulse: 93  Temp: 36.7 C  Resp: 20    Complications: No apparent anesthesia complications

## 2015-06-15 NOTE — Lactation Note (Signed)
This note was copied from the chart of [place]. Lactation Consultation Note; Mom has baby latched to the breast when I went into room. Needed some assistance with positioning baby and getting in a more comfortable position for herself. Nursed for a total of 30 min on both breasts. Mom reports she was having some trouble getting latched to left breast but baby latched well and some swallows noted. BF brochure given with resources for support after DC. Reviewed BFSG and OP appointments. Has her own personal Medela pump in room. Reports she has a few times and obtained just drops. Reassurnce given  Encouraged frequent breastfeeding to promote a good milk supply. No questions at present.   Patient Name: Kathleen Henry [id number] Date: 06/15/2015 Reason for consult: Initial assessment   Maternal Data Formula Feeding for Exclusion: No  Feeding Feeding Type: Breast Fed Length of feed: 30 min  LATCH Score/Interventions Latch: Grasps breast easily, tongue down, lips flanged, rhythmical sucking.  Audible Swallowing: A few with stimulation  Type of Nipple: Everted at rest and after stimulation  Comfort (Breast/Nipple): Soft / non-tender     Hold (Positioning): Assistance needed to correctly position infant at breast and maintain latch. Intervention(s): Breastfeeding basics reviewed;Support Pillows;Position options  LATCH Score: 8  Lactation Tools Discussed/Used     Consult Status Consult Status: Follow-up Date: 06/16/15 Follow-up type: In-patient    Truddie Crumble 06/15/2015, 1:38 PM

## 2015-06-15 NOTE — Progress Notes (Signed)
Kathleen Henry was taken to nursery for circ.  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   Defer circ to urology.   Parents are informed and agree.    Linda Hedges, DO

## 2015-06-15 NOTE — Progress Notes (Signed)
Post Partum Day 1 Subjective: no complaints, up ad lib, voiding, tolerating PO, + flatus and denies HA, or RUQ pain. Does take Inderal for sinus tachcardia  Objective: Blood pressure 140/93, pulse 93, temperature 98 F (36.7 C), temperature source Oral, resp. rate 20, height 5\' 7"  (1.702 m), weight 350 lb (158.759 kg), last menstrual period 09/05/2014, unknown if currently breastfeeding.  Physical Exam:  General: alert and cooperative Lochia: appropriate Uterine Fundus: firm Incision: healing well DVT Evaluation: No evidence of DVT seen on physical exam. Negative Homan's sign. No cords or calf tenderness. Calf/Ankle edema is present. DTR's 2+ , no clonus   Recent Labs  06/14/15 0450 06/15/15 0530  HGB 11.4* 10.7*  HCT 34.0* 32.4*    Assessment/Plan: Plan for discharge tomorrow, check CBC, CMP in am Baby with ? hypospadius   LOS: 1 day   CURTIS,CAROL G 06/15/2015, 8:16 AM   Patient wants circ.  Per peds, normal anatomy.  On my exam, centrally located meatus.  Will proceed with circ.  Counseled re: risk of bleeding, infection and scarring.  All questions were answered.

## 2015-06-16 LAB — CBC
HEMATOCRIT: 29.9 % — AB (ref 36.0–46.0)
HEMOGLOBIN: 9.6 g/dL — AB (ref 12.0–15.0)
MCH: 28.3 pg (ref 26.0–34.0)
MCHC: 32.1 g/dL (ref 30.0–36.0)
MCV: 88.2 fL (ref 78.0–100.0)
Platelets: 229 10*3/uL (ref 150–400)
RBC: 3.39 MIL/uL — ABNORMAL LOW (ref 3.87–5.11)
RDW: 15 % (ref 11.5–15.5)
WBC: 9.1 10*3/uL (ref 4.0–10.5)

## 2015-06-16 LAB — COMPREHENSIVE METABOLIC PANEL
ALK PHOS: 92 U/L (ref 38–126)
ALT: 15 U/L (ref 14–54)
ANION GAP: 6 (ref 5–15)
AST: 15 U/L (ref 15–41)
Albumin: 2.4 g/dL — ABNORMAL LOW (ref 3.5–5.0)
BUN: 12 mg/dL (ref 6–20)
CALCIUM: 8.6 mg/dL — AB (ref 8.9–10.3)
CO2: 24 mmol/L (ref 22–32)
Chloride: 108 mmol/L (ref 101–111)
Creatinine, Ser: 0.74 mg/dL (ref 0.44–1.00)
GFR calc Af Amer: >60 mL/min (ref >60)
GFR calc non Af Amer: >60 mL/min (ref >60)
GLUCOSE: 80 mg/dL (ref 65–99)
Potassium: 4.2 mmol/L (ref 3.5–5.1)
Sodium: 138 mmol/L (ref 135–145)
Total Bilirubin: 0.4 mg/dL (ref 0.3–1.2)
Total Protein: 5.5 g/dL — ABNORMAL LOW (ref 6.5–8.1)

## 2015-06-16 MED ORDER — IBUPROFEN 600 MG PO TABS: 600.0000 mg | ORAL_TABLET | Freq: Four times a day (QID) | ORAL | Status: DC

## 2015-06-16 NOTE — Discharge Summary (Signed)
Obstetric Discharge Summary Reason for Admission: induction of labor Prenatal Procedures: ultrasound Intrapartum Procedures: spontaneous vaginal delivery Postpartum Procedures: none Complications-Operative and Postpartum: 2 degree perineal laceration HEMOGLOBIN  Date Value Ref Range Status  06/16/2015 9.6* 12.0 - 15.0 g/dL Final   HCT  Date Value Ref Range Status  06/16/2015 29.9* 36.0 - 46.0 % Final    Physical Exam:  General: alert and cooperative Lochia: appropriate Uterine Fundus: firm Incision: healing well DVT Evaluation: No evidence of DVT seen on physical exam. Negative Homan's sign. No cords or calf tenderness. Calf/Ankle edema is present.  Discharge Diagnoses: Term Pregnancy-delivered  Discharge Information: Date: 06/16/2015 Activity: pelvic rest Diet: routine Medications: PNV, Ibuprofen and inderal and synthroid Condition: stable Instructions: refer to practice specific booklet Discharge to: home   Newborn Data: Live born female  Birth Weight: 7 lb 9.2 oz (3436 g) APGAR: 9, 9  Home with mother.  Ishmael Berkovich G 06/16/2015, 8:25 AM

## 2015-06-16 NOTE — Lactation Note (Signed)
This note was copied from the chart of [place]. Lactation Consultation Note  Patient Name: Kathleen Henry [id number] Date: 06/16/2015 Reason for consult: Follow-up assessment  With this mom of a term baby, now 60 hours old. Mom said breastfeeding was going well. She said her nipples were a little sore. I observed a latch, and advised her to use cross cradle as opposed to cradle. Mom latched the baby easily, and reported this latch feeling deeper. Mom is an experienced breast feeder, and knows sto call for questions/concerns.    Maternal Data    Feeding Feeding Type: Breast Fed Nipple Type: Slow - flow  LATCH Score/Interventions Latch: Grasps breast easily, tongue down, lips flanged, rhythmical sucking.  Audible Swallowing: Spontaneous and intermittent  Type of Nipple: Everted at rest and after stimulation (large nipples)  Comfort (Breast/Nipple): Soft / non-tender     Hold (Positioning): Assistance needed to correctly position infant at breast and maintain latch. Intervention(s): Breastfeeding basics reviewed;Support Pillows;Position options;Skin to skin  LATCH Score: 9  Lactation Tools Discussed/Used     Consult Status Consult Status: Complete Follow-up type: Call as needed    Tonna Corner 06/16/2015, 11:23 AM

## 2015-06-16 NOTE — Progress Notes (Signed)
Post Partum Day 2 Subjective: no complaints, up ad lib, voiding, tolerating PO and + flatus  Objective: Blood pressure 113/61, pulse 88, temperature 97.6 F (36.4 C), temperature source Oral, resp. rate 18, height 5\' 7"  (1.702 m), weight 350 lb (158.759 kg), last menstrual period 09/05/2014, unknown if currently breastfeeding.  Physical Exam:  General: alert and cooperative Lochia: appropriate Uterine Fundus: firm Incision: healing well DVT Evaluation: No evidence of DVT seen on physical exam. Negative Homan's sign. No cords or calf tenderness. Calf/Ankle edema is present.   Recent Labs  06/15/15 0530 06/16/15 0535  HGB 10.7* 9.6*  HCT 32.4* 29.9*    Assessment/Plan: Discharge home   LOS: 2 days   Daleisa Halperin G 06/16/2015, 8:23 AM

## 2015-07-19 ENCOUNTER — Other Ambulatory Visit: Payer: Self-pay | Admitting: Obstetrics and Gynecology

## 2015-07-20 LAB — CYTOLOGY - PAP

## 2016-12-05 ENCOUNTER — Other Ambulatory Visit: Payer: Self-pay | Admitting: Obstetrics and Gynecology

## 2016-12-05 HISTORY — PX: DILATION AND CURETTAGE OF UTERUS: SHX78

## 2016-12-05 HISTORY — PX: ENDOMETRIAL ABLATION: SHX621

## 2016-12-05 HISTORY — PX: TUBAL LIGATION: SHX77

## 2016-12-19 MED FILL — OMEPRAZOLE DR 20 MG CAPSULE: 20 | 90 days supply | Qty: 90 | Fill #0

## 2016-12-19 MED FILL — FLUTICASONE PROP 50 MCG SPR: 50 | 60 days supply | Qty: 16 | Fill #0

## 2016-12-19 MED FILL — LEVOTHYROXINE 125 MCG TAB: 125 | 30 days supply | Qty: 30 | Fill #0

## 2016-12-22 ENCOUNTER — Emergency Department (HOSPITAL_BASED_OUTPATIENT_CLINIC_OR_DEPARTMENT_OTHER)
Admission: EM | Admit: 2016-12-22 | Discharge: 2016-12-22 | Disposition: A | Discharge status: Home / Self Care | Payer: 59 | Attending: Emergency Medicine | Admitting: Emergency Medicine

## 2016-12-22 ENCOUNTER — Encounter (HOSPITAL_BASED_OUTPATIENT_CLINIC_OR_DEPARTMENT_OTHER): Payer: Self-pay | Admitting: Emergency Medicine

## 2016-12-22 DIAGNOSIS — I808 Phlebitis and thrombophlebitis of other sites: Secondary | ICD-10-CM

## 2016-12-22 DIAGNOSIS — M7989 Other specified soft tissue disorders: Secondary | ICD-10-CM | POA: Diagnosis present

## 2016-12-22 DIAGNOSIS — E039 Hypothyroidism, unspecified: Secondary | ICD-10-CM | POA: Insufficient documentation

## 2016-12-22 DIAGNOSIS — I82611 Acute embolism and thrombosis of superficial veins of right upper extremity: Secondary | ICD-10-CM | POA: Insufficient documentation

## 2016-12-22 DIAGNOSIS — Z79899 Other long term (current) drug therapy: Secondary | ICD-10-CM | POA: Insufficient documentation

## 2016-12-22 MED ORDER — ASPIRIN EC 325 MG PO TBEC: 650.0000 mg | DELAYED_RELEASE_TABLET | Freq: Every day | ORAL | 0 refills | Status: AC

## 2016-12-22 NOTE — ED Provider Notes (Signed)
[facility] [facility] Provider Note   CSN: [id number] Arrival date & time: 12/22/16  1112     History   Chief Complaint Chief Complaint  Patient presents with  . Arm Injury    HPI Kathleen Henry is a 41 y.o. female.  The history is provided by the patient.  Extremity Pain  This is a new problem. The current episode started more than 1 week ago. The problem occurs constantly. Progression since onset: moved from hand to volar forearm. Pertinent negatives include no chest pain, no abdominal pain and no shortness of breath. Nothing aggravates the symptoms. Nothing relieves the symptoms. She has tried nothing for the symptoms.    Past Medical History:  Diagnosis Date  . AMA (advanced maternal age) multigravida 34+   . Benign cyst of skin 2014   To neck- removed  . History of PCOS   . Hypothyroidism   . Sinus tachycardia     Patient Active Problem List   Diagnosis Date Noted  . Term pregnancy 06/14/2015  . [redacted] weeks gestation of pregnancy   . Evaluate anatomy not seen on prior sonogram   . Maternal morbid obesity, antepartum ([place])   . Hypothyroidism affecting pregnancy in second trimester, antepartum   . [redacted] weeks gestation of pregnancy   . Advanced maternal age in multigravida   . AMA (advanced maternal age) multigravida 24+   . Encounter for fetal anatomic survey   . Obesity affecting pregnancy in second trimester, antepartum     Past Surgical History:  Procedure Laterality Date  . CESAREAN SECTION      OB History    Gravida Para Term Preterm AB Living   3 3 3     3    SAB TAB Ectopic Multiple Live Births         0 3       Home Medications    Prior to Admission medications   Medication Sig Start Date End Date Taking? Authorizing Provider  ibuprofen (ADVIL,MOTRIN) 600 MG tablet Take 1 tablet (600 mg total) by mouth every 6 (six) hours. 06/16/15  Yes Juanda Chance, NP  levothyroxine (SYNTHROID, LEVOTHROID) 150 MCG tablet Take 150 mcg by mouth daily before  breakfast.   Yes Historical Provider, MD  omeprazole (PRILOSEC) 20 MG capsule Take 20 mg by mouth daily.   Yes Historical Provider, MD  Prenatal Vit-Fe Fumarate-FA (PRENATAL MULTIVITAMIN) TABS tablet Take 1 tablet by mouth daily at 12 noon.   Yes Historical Provider, MD  propranolol (INDERAL) 10 MG tablet Take 0.5 tablets by mouth 4 (four) times daily. 5mg  four times a day. 05/26/14  Yes Historical Provider, MD    Family History Family History  Problem Relation Age of Onset  . Hypertension Mother   . Cancer Mother     colon  . CAD Father   . Hypertension Father   . Diabetes Father     Type 2  . Thyroid disease Father   . Kidney disease Father   . Diabetes Daughter     Type 1  . Cancer Paternal Grandmother     liver  . Cancer Paternal Grandfather     leaukemia    Social History Social History  Substance Use Topics  . Smoking status: Never Smoker  . Smokeless tobacco: Never Used  . Alcohol use No     Allergies   Penicillins   Review of Systems Review of Systems  Respiratory: Negative for shortness of breath.   Cardiovascular: Negative for chest pain.  Gastrointestinal: Negative for abdominal pain.  All other systems reviewed and are negative.    Physical Exam Updated Vital Signs BP 152/98 (BP Location: Left Arm)   Pulse 102   Temp 98.1 F (36.7 C) (Oral)   Resp 22   Ht 5\' 7"  (1.702 m)   Wt 295 lb (133.8 kg)   LMP  (LMP Unknown)   SpO2 100%   BMI 46.20 kg/m   Physical Exam  Constitutional: She is oriented to person, place, and time. She appears well-developed and well-nourished. No distress.  HENT:  Head: Normocephalic.  Nose: Nose normal.  Eyes: Conjunctivae are normal.  Neck: Neck supple. No tracheal deviation present.  Cardiovascular: Normal rate and regular rhythm.   Pulmonary/Chest: Effort normal. No respiratory distress.  Abdominal: Soft. She exhibits no distension.  Musculoskeletal:  Volar right forearm with small palpable tender cord and  minimal edema  Neurological: She is alert and oriented to person, place, and time.  Skin: Skin is warm and dry.  Psychiatric: She has a normal mood and affect.     ED Treatments / Results  Labs (all labs ordered are listed, but only abnormal results are displayed) Labs Reviewed - No data to display  EKG  EKG Interpretation None       Radiology No results found.  Procedures Procedures (including critical care time)  Medications Ordered in ED Medications - No data to display   Initial Impression / Assessment and Plan / ED Course  I have reviewed the triage vital signs and the nursing notes.  Pertinent labs & imaging results that were available during my care of the patient were reviewed by me and considered in my medical decision making (see chart for details).  Clinical Course     41 y.o. female presents with a mild superficial thrombophlebitis of the right volar forearm which initially started as right dorsal hand swelling after IV placement for an outpatient surgical procedure over 2 weeks ago and has since progressed slowly proximally. Discussed supportive care measures including warm compresses and NSAIDs, specifically aspirin for antiplatelet benefit to prevent further propagation.  Final Clinical Impressions(s) / ED Diagnoses   Final diagnoses:  Superficial thrombophlebitis of right upper extremity    New Prescriptions New Prescriptions   ASPIRIN EC 325 MG TABLET    Take 2 tablets (650 mg total) by mouth daily.     Leo Grosser, MD 12/22/16 1226

## 2016-12-22 NOTE — ED Triage Notes (Signed)
States had gyn surgery on 12/05/16 and had a IV in her right hand and has a knot to her post right arm and feels it is related to IV , no obvious signs of infection noted, CMS intact

## 2016-12-24 ENCOUNTER — Encounter (HOSPITAL_COMMUNITY): Payer: Self-pay | Admitting: Emergency Medicine

## 2016-12-24 ENCOUNTER — Emergency Department (HOSPITAL_COMMUNITY)
Admission: EM | Admit: 2016-12-24 | Discharge: 2016-12-24 | Disposition: A | Discharge status: Home / Self Care | Payer: 59 | Attending: Physician Assistant | Admitting: Physician Assistant

## 2016-12-24 DIAGNOSIS — E039 Hypothyroidism, unspecified: Secondary | ICD-10-CM | POA: Insufficient documentation

## 2016-12-24 DIAGNOSIS — Z7982 Long term (current) use of aspirin: Secondary | ICD-10-CM | POA: Diagnosis not present

## 2016-12-24 DIAGNOSIS — K625 Hemorrhage of anus and rectum: Secondary | ICD-10-CM | POA: Diagnosis not present

## 2016-12-24 LAB — CBC
HCT: 38.5 % (ref 36.0–46.0)
Hemoglobin: 12.5 g/dL (ref 12.0–15.0)
MCH: 26.4 pg (ref 26.0–34.0)
MCHC: 32.5 g/dL (ref 30.0–36.0)
MCV: 81.2 fL (ref 78.0–100.0)
Platelets: 271 10*3/uL (ref 150–400)
RBC: 4.74 MIL/uL (ref 3.87–5.11)
RDW: 13.6 % (ref 11.5–15.5)
WBC: 8.6 10*3/uL (ref 4.0–10.5)

## 2016-12-24 LAB — COMPREHENSIVE METABOLIC PANEL
ALBUMIN: 3.6 g/dL (ref 3.5–5.0)
ALT: 18 U/L (ref 14–54)
AST: 15 U/L (ref 15–41)
Alkaline Phosphatase: 78 U/L (ref 38–126)
Anion gap: 9 (ref 5–15)
BUN: 11 mg/dL (ref 6–20)
CHLORIDE: 105 mmol/L (ref 101–111)
CO2: 24 mmol/L (ref 22–32)
CREATININE: 0.62 mg/dL (ref 0.44–1.00)
Calcium: 8.4 mg/dL — ABNORMAL LOW (ref 8.9–10.3)
GFR calc Af Amer: >60 mL/min (ref >60)
GLUCOSE: 125 mg/dL — AB (ref 65–99)
Potassium: 3.5 mmol/L (ref 3.5–5.1)
Sodium: 138 mmol/L (ref 135–145)
Total Bilirubin: 0.6 mg/dL (ref 0.3–1.2)
Total Protein: 7.1 g/dL (ref 6.5–8.1)

## 2016-12-24 LAB — ABO/RH: ABO/RH(D): A POS

## 2016-12-24 LAB — TYPE AND SCREEN
ABO/RH(D): A POS
Antibody Screen: NEGATIVE

## 2016-12-24 NOTE — Discharge Instructions (Signed)
We're unsure what caused her rectal bleeding today. However do not have any further episodes here. Your labs appear normal and your vital signs appear normal. As we discussed if you've any increased bleeding, dizziness, vomiting, or other concerns please return immediately.  We have referred you to gastroenterology for a colonoscopy because of your family history of colon cancer.  We also recommend that you increase her Protonix to 40 mg twice a day since she been taking so much ibuprofen.  We also recommend that you use Tylenol instead of  ibuprofen for the next several weeks.

## 2016-12-24 NOTE — ED Provider Notes (Signed)
[facility] DEPT Provider Note   CSN: [id number] Arrival date & time: 12/24/16  0457     History   Chief Complaint Chief Complaint  Patient presents with  . Rectal Bleeding    HPI Kathleen Henry is a 41 y.o. female.  HPI   Patient's 41 year old female presenting with one episode of bright red blood in her stool. Patient had recent surgery done by OB/GYN 2 weeks ago where she received an IV.   Patient was then diagnosed with thrombophlebitis from the IV. Started on aspirin.  Then patient developed hives which were thought to be an reaction to the aspirin. So she stopped aspirin 2 days ago.  Patietn has also been taking a lot of ibuprofen after the surgery because of pain.    Past Medical History:  Diagnosis Date  . AMA (advanced maternal age) multigravida 66+   . Benign cyst of skin 2014   To neck- removed  . History of PCOS   . Hypothyroidism   . Sinus tachycardia     Patient Active Problem List   Diagnosis Date Noted  . Term pregnancy 06/14/2015  . [redacted] weeks gestation of pregnancy   . Evaluate anatomy not seen on prior sonogram   . Maternal morbid obesity, antepartum ([place])   . Hypothyroidism affecting pregnancy in second trimester, antepartum   . [redacted] weeks gestation of pregnancy   . Advanced maternal age in multigravida   . AMA (advanced maternal age) multigravida 47+   . Encounter for fetal anatomic survey   . Obesity affecting pregnancy in second trimester, antepartum     Past Surgical History:  Procedure Laterality Date  . CESAREAN SECTION    . DILATION AND CURETTAGE OF UTERUS  12/05/2016  . ENDOMETRIAL ABLATION  12/05/2016  . TUBAL LIGATION  12/05/2016    OB History    Gravida Para Term Preterm AB Living   3 3 3     3    SAB TAB Ectopic Multiple Live Births         0 3       Home Medications    Prior to Admission medications   Medication Sig Start Date End Date Taking? Authorizing Provider  aspirin EC 325 MG tablet Take 2 tablets (650 mg  total) by mouth daily. 12/22/16 01/01/17  Leo Grosser, MD  ibuprofen (ADVIL,MOTRIN) 600 MG tablet Take 1 tablet (600 mg total) by mouth every 6 (six) hours. 06/16/15   Juanda Chance, NP  levothyroxine (SYNTHROID, LEVOTHROID) 150 MCG tablet Take 150 mcg by mouth daily before breakfast.    Historical Provider, MD  omeprazole (PRILOSEC) 20 MG capsule Take 20 mg by mouth daily.    Historical Provider, MD  Prenatal Vit-Fe Fumarate-FA (PRENATAL MULTIVITAMIN) TABS tablet Take 1 tablet by mouth daily at 12 noon.    Historical Provider, MD  propranolol (INDERAL) 10 MG tablet Take 0.5 tablets by mouth 4 (four) times daily. 5mg  four times a day. 05/26/14   Historical Provider, MD    Family History Family History  Problem Relation Age of Onset  . Hypertension Mother   . Cancer Mother     colon  . CAD Father   . Hypertension Father   . Diabetes Father     Type 2  . Thyroid disease Father   . Kidney disease Father   . Diabetes Daughter     Type 1  . Cancer Paternal Grandmother     liver  . Cancer Paternal Grandfather     leaukemia  Social History Social History  Substance Use Topics  . Smoking status: Never Smoker  . Smokeless tobacco: Never Used  . Alcohol use No     Allergies   Aspirin and Penicillins   Review of Systems Review of Systems  Constitutional: Negative for fatigue.  Gastrointestinal: Positive for anal bleeding. Negative for abdominal distention, abdominal pain, constipation, nausea and vomiting.  Genitourinary: Negative for dysuria.  Neurological: Negative for dizziness and light-headedness.  All other systems reviewed and are negative.    Physical Exam Updated Vital Signs BP 104/65 (BP Location: Left Arm)   Pulse 97   Temp 97.6 F (36.4 C) (Oral)   Resp 16   Ht 5\' 7"  (1.702 m)   Wt 295 lb (133.8 kg)   LMP  (LMP Unknown)   SpO2 97%   BMI 46.20 kg/m   Physical Exam  Constitutional: She is oriented to person, place, and time. She appears well-developed and  well-nourished.  HENT:  Head: Normocephalic and atraumatic.  Eyes: Right eye exhibits no discharge.  Cardiovascular: Normal rate, regular rhythm and normal heart sounds.   No murmur heard. Pulmonary/Chest: Effort normal and breath sounds normal. She has no wheezes. She has no rales.  Abdominal: Soft. She exhibits no distension. There is no tenderness.  Genitourinary:  Genitourinary Comments: Rectal exam shows no hemorrhoids, empty vault no blood.  Neurological: She is oriented to person, place, and time.  Skin: Skin is warm and dry. She is not diaphoretic.  Psychiatric: She has a normal mood and affect.  Nursing note and vitals reviewed.    ED Treatments / Results  Labs (all labs ordered are listed, but only abnormal results are displayed) Labs Reviewed  COMPREHENSIVE METABOLIC PANEL - Abnormal; Notable for the following:       Result Value   Glucose, Bld 125 (*)    Calcium 8.4 (*)    All other components within normal limits  CBC  POC OCCULT BLOOD, ED  TYPE AND SCREEN  ABO/RH    EKG  EKG Interpretation None       Radiology No results found.  Procedures Procedures (including critical care time)  Medications Ordered in ED Medications - No data to display   Initial Impression / Assessment and Plan / ED Course  I have reviewed the triage vital signs and the nursing notes.  Pertinent labs & imaging results that were available during my care of the patient were reviewed by me and considered in my medical decision making (see chart for details).  Clinical Course     Patient is a 41 year old female presenting with one episode of rectal bleeding. Patient has recently been taking aspirin for thrombophlebitis. She's also been on ibuprofen for the last 2 weeks after surgery.  Patient has normal vital signs, stable hemoglobin.. We will observe.  No abdominal pain, no fever, no vomiting.  Of note patient's husband remarks that patient is sensitive about rectal  bleeding because her mother had cold rectal cancer young age.We are referring her to GI for colonscopy.   9:49 AM Patient observed in ED. We will have patient double her protonix. Patient has had no further bleeding. Has not had any dark stools. Patient had 1 stool here that was normal. We had extensive conversation about return precautions. Both patient and husband agreed to return home, and to monitor symptoms and come back with any concerns.  Final Clinical Impressions(s) / ED Diagnoses   Final diagnoses:  None    New Prescriptions New Prescriptions  No medications on file     [place], MD 12/24/16 [phone]

## 2016-12-24 NOTE — ED Triage Notes (Signed)
Pt reports having blood in stool that was first noticed last night bright red in color. Pt states that she had started on Aspirin for a thrombophlebitis from a recent IV and then began having a rash from aspirin. Pt PCP advised to stop aspirin and take benadryl for itching. Pt states that she has prior hx of hemorids.

## 2016-12-26 DIAGNOSIS — Z800 Family history of malignant neoplasm of digestive organs: Secondary | ICD-10-CM | POA: Diagnosis not present

## 2016-12-26 DIAGNOSIS — K625 Hemorrhage of anus and rectum: Secondary | ICD-10-CM | POA: Diagnosis not present

## 2016-12-30 DIAGNOSIS — I808 Phlebitis and thrombophlebitis of other sites: Secondary | ICD-10-CM | POA: Diagnosis not present

## 2016-12-30 DIAGNOSIS — K625 Hemorrhage of anus and rectum: Secondary | ICD-10-CM | POA: Diagnosis not present

## 2017-01-03 DIAGNOSIS — Z09 Encounter for follow-up examination after completed treatment for conditions other than malignant neoplasm: Secondary | ICD-10-CM | POA: Diagnosis not present

## 2017-01-03 DIAGNOSIS — D509 Iron deficiency anemia, unspecified: Secondary | ICD-10-CM | POA: Diagnosis not present

## 2017-01-20 DIAGNOSIS — K640 First degree hemorrhoids: Secondary | ICD-10-CM | POA: Diagnosis not present

## 2017-01-20 DIAGNOSIS — K5730 Diverticulosis of large intestine without perforation or abscess without bleeding: Secondary | ICD-10-CM | POA: Diagnosis not present

## 2017-01-20 DIAGNOSIS — Z1211 Encounter for screening for malignant neoplasm of colon: Secondary | ICD-10-CM | POA: Diagnosis not present

## 2017-01-20 DIAGNOSIS — K635 Polyp of colon: Secondary | ICD-10-CM | POA: Diagnosis not present

## 2017-01-20 DIAGNOSIS — Z800 Family history of malignant neoplasm of digestive organs: Secondary | ICD-10-CM | POA: Diagnosis not present

## 2017-01-23 MED FILL — LEVOTHYROXINE 125 MCG TAB: 125 | 30 days supply | Qty: 30 | Fill #1

## 2017-02-18 MED FILL — LEVOTHYROXINE 125 MCG TAB: 125 | 30 days supply | Qty: 30 | Fill #2

## 2017-02-18 MED FILL — FLUTICASONE PROP 50 MCG SPR: 50 | 60 days supply | Qty: 16 | Fill #1

## 2017-02-28 DIAGNOSIS — E039 Hypothyroidism, unspecified: Secondary | ICD-10-CM | POA: Diagnosis not present

## 2017-02-28 DIAGNOSIS — K219 Gastro-esophageal reflux disease without esophagitis: Secondary | ICD-10-CM | POA: Diagnosis not present

## 2017-02-28 DIAGNOSIS — R002 Palpitations: Secondary | ICD-10-CM | POA: Diagnosis not present

## 2017-03-21 MED FILL — LEVOTHYROXINE 125 MCG TAB: 125 | 90 days supply | Qty: 90 | Fill #0

## 2017-03-21 MED FILL — OMEPRAZOLE DR 20 MG CAPSULE: 20 | 90 days supply | Qty: 90 | Fill #1

## 2017-04-15 DIAGNOSIS — H5203 Hypermetropia, bilateral: Secondary | ICD-10-CM | POA: Diagnosis not present

## 2017-04-15 DIAGNOSIS — H524 Presbyopia: Secondary | ICD-10-CM | POA: Diagnosis not present

## 2017-04-15 DIAGNOSIS — H52222 Regular astigmatism, left eye: Secondary | ICD-10-CM | POA: Diagnosis not present

## 2017-04-23 MED FILL — FLUTICASONE PROP 50 MCG SPR: 50 | 60 days supply | Qty: 16 | Fill #2

## 2017-06-19 DIAGNOSIS — L209 Atopic dermatitis, unspecified: Secondary | ICD-10-CM | POA: Diagnosis not present

## 2017-06-19 MED FILL — FLUTICASONE PROP 50 MCG SPR: 50 | 60 days supply | Qty: 16 | Fill #3

## 2017-06-19 MED FILL — LEVOTHYROXINE 125 MCG TAB: 125 | 90 days supply | Qty: 90 | Fill #1

## 2017-06-20 MED FILL — OMEPRAZOLE DR 20 MG CAPSULE: 20 | 90 days supply | Qty: 90 | Fill #0

## 2017-07-24 DIAGNOSIS — N939 Abnormal uterine and vaginal bleeding, unspecified: Secondary | ICD-10-CM | POA: Diagnosis not present

## 2017-07-24 DIAGNOSIS — R109 Unspecified abdominal pain: Secondary | ICD-10-CM | POA: Diagnosis not present

## 2017-08-26 DIAGNOSIS — Z6841 Body Mass Index (BMI) 40.0 and over, adult: Secondary | ICD-10-CM | POA: Diagnosis not present

## 2017-08-26 DIAGNOSIS — Z01419 Encounter for gynecological examination (general) (routine) without abnormal findings: Secondary | ICD-10-CM | POA: Diagnosis not present

## 2017-09-11 MED FILL — FLUTICASONE PROP 50 MCG SPR: 50 | 60 days supply | Qty: 16 | Fill #0

## 2017-09-11 MED FILL — LEVOTHYROXINE 125 MCG TAB: 125 | 90 days supply | Qty: 90 | Fill #2

## 2017-09-11 MED FILL — OMEPRAZOLE 20 MG CAP: 20 | 90 days supply | Qty: 90 | Fill #1

## 2017-10-30 ENCOUNTER — Ambulatory Visit (INDEPENDENT_AMBULATORY_CARE_PROVIDER_SITE_OTHER): Payer: 59 | Admitting: Podiatry

## 2017-10-30 ENCOUNTER — Ambulatory Visit (INDEPENDENT_AMBULATORY_CARE_PROVIDER_SITE_OTHER): Payer: 59

## 2017-10-30 ENCOUNTER — Encounter: Payer: Self-pay | Admitting: Podiatry

## 2017-10-30 DIAGNOSIS — M722 Plantar fascial fibromatosis: Secondary | ICD-10-CM

## 2017-10-30 MED ORDER — METHYLPREDNISOLONE 4 MG PO TBPK: ORAL_TABLET | ORAL | 0 refills | Status: DC

## 2017-10-30 NOTE — Progress Notes (Signed)
She presents today she complaining of a painful right arch times past 3 months she stills feels like there is pins and needles there. She states that it may be fasciitis again.  Objective: Vital signs are stable she is alert and oriented 3 pulses are palpable. She has pain on direct palpation medially continued to roll the right heel.  Assessment: Plantar fasciitis right foot.  Plan: Injected the right heel today with a local anesthetic start her on Medrol Dosepak placed from plantar fascial brace. Follow up with her in 1 month. We discussed proper shoe gear stretching exercises and ice therapy.

## 2017-11-05 MED FILL — FLUTICASONE PROP 50 MCG SPR: 50 | 60 days supply | Qty: 16 | Fill #1

## 2017-11-27 ENCOUNTER — Ambulatory Visit (INDEPENDENT_AMBULATORY_CARE_PROVIDER_SITE_OTHER): Payer: 59 | Admitting: Podiatry

## 2017-11-27 ENCOUNTER — Encounter: Payer: Self-pay | Admitting: Podiatry

## 2017-11-27 DIAGNOSIS — M722 Plantar fascial fibromatosis: Secondary | ICD-10-CM

## 2017-11-27 NOTE — Progress Notes (Signed)
She presents today for follow-up of her plantar fasciitis to her right heel.  She states that it is approximately 95% improved and she continues all of her conservative therapies.  Objective: Vital signs are stable she is alert and oriented x3 pulses are palpable.  He has no pain on palpation medial calcaneal tubercle of the right heel or the left heel.  Assessment: 95% resolution of plantar fasciitis of the right foot.  Plan: Continue all conservative therapies listed above and follow-up with me on an as-needed basis.

## 2017-12-03 MED FILL — LEVOTHYROXINE 125 MCG TAB: 125 | 90 days supply | Qty: 90 | Fill #3

## 2017-12-03 MED FILL — OMEPRAZOLE 20 MG CAP: 20 | 90 days supply | Qty: 90 | Fill #0

## 2018-01-01 MED FILL — FLUTICASONE PROP 50 MCG SPR: 50 | 60 days supply | Qty: 16 | Fill #2

## 2018-02-10 ENCOUNTER — Ambulatory Visit: Payer: 59 | Admitting: Podiatry

## 2018-02-10 ENCOUNTER — Emergency Department (HOSPITAL_COMMUNITY): Payer: 59

## 2018-02-10 ENCOUNTER — Encounter (HOSPITAL_COMMUNITY): Payer: Self-pay | Admitting: Emergency Medicine

## 2018-02-10 ENCOUNTER — Emergency Department (HOSPITAL_COMMUNITY)
Admission: EM | Admit: 2018-02-10 | Discharge: 2018-02-10 | Disposition: A | Discharge status: Home / Self Care | Payer: 59 | Attending: Emergency Medicine | Admitting: Emergency Medicine

## 2018-02-10 DIAGNOSIS — K625 Hemorrhage of anus and rectum: Secondary | ICD-10-CM | POA: Diagnosis not present

## 2018-02-10 DIAGNOSIS — N83292 Other ovarian cyst, left side: Secondary | ICD-10-CM | POA: Diagnosis not present

## 2018-02-10 DIAGNOSIS — Z79899 Other long term (current) drug therapy: Secondary | ICD-10-CM | POA: Diagnosis not present

## 2018-02-10 DIAGNOSIS — R197 Diarrhea, unspecified: Secondary | ICD-10-CM | POA: Diagnosis not present

## 2018-02-10 DIAGNOSIS — E039 Hypothyroidism, unspecified: Secondary | ICD-10-CM | POA: Insufficient documentation

## 2018-02-10 LAB — COMPREHENSIVE METABOLIC PANEL
ALK PHOS: 82 U/L (ref 38–126)
ALT: 18 U/L (ref 14–54)
ANION GAP: 11 (ref 5–15)
AST: 17 U/L (ref 15–41)
Albumin: 3.7 g/dL (ref 3.5–5.0)
BUN: 10 mg/dL (ref 6–20)
CALCIUM: 8.8 mg/dL — AB (ref 8.9–10.3)
CO2: 23 mmol/L (ref 22–32)
Chloride: 106 mmol/L (ref 101–111)
Creatinine, Ser: 0.83 mg/dL (ref 0.44–1.00)
Glucose, Bld: 97 mg/dL (ref 65–99)
POTASSIUM: 3.5 mmol/L (ref 3.5–5.1)
Sodium: 140 mmol/L (ref 135–145)
TOTAL PROTEIN: 7.4 g/dL (ref 6.5–8.1)
Total Bilirubin: 0.8 mg/dL (ref 0.3–1.2)

## 2018-02-10 LAB — I-STAT BETA HCG BLOOD, ED (MC, WL, AP ONLY)

## 2018-02-10 LAB — CBC
HEMATOCRIT: 38.3 % (ref 36.0–46.0)
HEMOGLOBIN: 12.9 g/dL (ref 12.0–15.0)
MCH: 28 pg (ref 26.0–34.0)
MCHC: 33.7 g/dL (ref 30.0–36.0)
MCV: 83.1 fL (ref 78.0–100.0)
Platelets: 322 10*3/uL (ref 150–400)
RBC: 4.61 MIL/uL (ref 3.87–5.11)
RDW: 13 % (ref 11.5–15.5)
WBC: 10.8 10*3/uL — AB (ref 4.0–10.5)

## 2018-02-10 MED ORDER — IOPAMIDOL (ISOVUE-300) INJECTION 61%
INTRAVENOUS | Status: AC
  Administered 2018-02-10: 1 mL via INTRAVENOUS
  Filled 2018-02-10: qty 100

## 2018-02-10 MED ORDER — SODIUM CHLORIDE 0.9 % IJ SOLN
INTRAMUSCULAR | Status: AC
  Administered 2018-02-10: 14:00:00
  Filled 2018-02-10: qty 50

## 2018-02-10 MED ORDER — IOPAMIDOL (ISOVUE-300) INJECTION 61%
100.0000 mL | Freq: Once | INTRAVENOUS | Status: AC | PRN
Start: 2018-02-10 — End: 2018-02-10
  Administered 2018-02-10: 1 mL via INTRAVENOUS
  Administered 2018-02-10: 100 mL via INTRAVENOUS

## 2018-02-10 NOTE — ED Notes (Signed)
This writer attempt twice to collect labs in triage. unsuccessful

## 2018-02-10 NOTE — ED Provider Notes (Signed)
[facility] DEPT Provider Note   CSN: [id number] Arrival date & time: 02/10/18  0424     History   Chief Complaint Chief Complaint  Patient presents with  . Rectal Bleeding    HPI DINORAH MASULLO is a 42 y.o. female.  The history is provided by the patient. No language interpreter was used.  Rectal Bleeding    CHARLESE GRUETZMACHER is a 42 y.o. female who presents to the Emergency Department complaining of hematochezia.  She presents for evaluation of bloody stools.  Around 3 AM she had mucousy stool with a small amount of blood mixed in.  She has had 3 additional episodes since the first.  She also reports associated nausea.  No fevers, vomiting.  She does have significant abdominal bloating that is been progressive over last 3 weeks.  She reports that bloating has been present for the last year after endometrial ablation but significantly worse recently.  No significant abdominal pain but she feels a fleeting discomfort at times.  She had a similar episode one year ago and was diagnosed with diverticulosis.  She has been seen by [facility] with colonoscopy.  She did have polyps removed at that time.  She has a family history of colon cancer her mother.  Past Medical History:  Diagnosis Date  . AMA (advanced maternal age) multigravida 81+   . Benign cyst of skin 2014   To neck- removed  . History of PCOS   . Hypothyroidism   . Sinus tachycardia     Patient Active Problem List   Diagnosis Date Noted  . Term pregnancy 06/14/2015  . [redacted] weeks gestation of pregnancy   . Evaluate anatomy not seen on prior sonogram   . Maternal morbid obesity, antepartum ([place])   . Hypothyroidism affecting pregnancy in second trimester, antepartum   . [redacted] weeks gestation of pregnancy   . Advanced maternal age in multigravida   . AMA (advanced maternal age) multigravida 54+   . Encounter for fetal anatomic survey   . Obesity affecting pregnancy in second trimester, antepartum       Past Surgical History:  Procedure Laterality Date  . CESAREAN SECTION    . DILATION AND CURETTAGE OF UTERUS  12/05/2016  . ENDOMETRIAL ABLATION  12/05/2016  . TUBAL LIGATION  12/05/2016    OB History    Gravida Para Term Preterm AB Living   3 3 3     3    SAB TAB Ectopic Multiple Live Births         0 3       Home Medications    Prior to Admission medications   Medication Sig Start Date End Date Taking? Authorizing Provider  fluticasone (FLONASE) 50 MCG/ACT nasal spray Place 1 spray into both nostrils daily as needed for allergies or rhinitis.   Yes [provider]  ibuprofen (ADVIL,MOTRIN) 200 MG tablet Take 200-600 mg by mouth every 6 (six) hours as needed for headache or moderate pain.   Yes [provider]  levothyroxine (SYNTHROID, LEVOTHROID) 125 MCG tablet Take 125 mcg by mouth daily before breakfast.   Yes [provider]  nystatin cream (MYCOSTATIN) Apply 1 application topically daily as needed for itching. 08/29/17  Yes [provider]  omeprazole (PRILOSEC) 20 MG capsule Take 20 mg by mouth daily.   Yes [provider]  Prenatal Vit-Fe Fumarate-FA (PRENATAL MULTIVITAMIN) TABS tablet Take 1 tablet by mouth daily at 12 noon.   Yes [provider]  propranolol (INDERAL) 10 MG tablet Take 10 mg by mouth 2 (two) times daily as needed (heart palpations).  05/26/14  Yes [provider]  Vitamin D, Cholecalciferol, 1000 units TABS Take 5,000 Units by mouth daily.    Yes [provider]    Family History Family History  Problem Relation Age of Onset  . Hypertension Mother   . Cancer Mother        colon  . CAD Father   . Hypertension Father   . Diabetes Father        Type 2  . Thyroid disease Father   . Kidney disease Father   . Diabetes Daughter        Type 1  . Cancer Paternal Grandmother        liver  . Cancer Paternal Grandfather        leaukemia    Social History Social History    Tobacco Use  . Smoking status: Never Smoker  . Smokeless tobacco: Never Used  Substance Use Topics  . Alcohol use: No  . Drug use: No     Allergies   Aspirin and Penicillins   Review of Systems Review of Systems  Gastrointestinal: Positive for hematochezia.  All other systems reviewed and are negative.    Physical Exam Updated Vital Signs BP 124/79   Pulse 87   Temp 98 F (36.7 C) (Oral)   Resp 16   Ht 5\' 7"  (1.702 m)   Wt (!) 139.3 kg (307 lb)   SpO2 100%   BMI 48.08 kg/m   Physical Exam  Constitutional: She is oriented to person, place, and time. She appears well-developed and well-nourished.  HENT:  Head: Normocephalic and atraumatic.  Cardiovascular: Normal rate and regular rhythm.  No murmur heard. Pulmonary/Chest: Effort normal and breath sounds normal. No respiratory distress.  Abdominal: Soft. There is no tenderness. There is no rebound and no guarding.  Musculoskeletal: She exhibits no edema or tenderness.  Neurological: She is alert and oriented to person, place, and time.  Skin: Skin is warm and dry.  Psychiatric: She has a normal mood and affect. Her behavior is normal.  Nursing note and vitals reviewed.    ED Treatments / Results  Labs (all labs ordered are listed, but only abnormal results are displayed) Labs Reviewed  COMPREHENSIVE METABOLIC PANEL - Abnormal; Notable for the following components:      Result Value   Calcium 8.8 (*)    All other components within normal limits  CBC - Abnormal; Notable for the following components:   WBC 10.8 (*)    All other components within normal limits  I-STAT BETA HCG BLOOD, ED ([facility], [facility], [facility] ONLY)    EKG  EKG Interpretation None       Radiology Ct Abdomen Pelvis W Contrast  Result Date: 02/10/2018 CLINICAL DATA:  Abdominal distention. Bloody diarrhea since 3 a.m. patient had similar symptoms in the past and was diagnosed with diverticulosis per report. EXAM: CT ABDOMEN AND PELVIS WITH  CONTRAST TECHNIQUE: Multidetector CT imaging of the abdomen and pelvis was performed using the standard protocol following bolus administration of intravenous contrast. CONTRAST:  100 cc Isovue-300 intravenous COMPARISON:  None. FINDINGS: Lower chest:  No contributory findings. Hepatobiliary: No focal liver abnormality.No evidence of biliary obstruction or stone. Pancreas: Unremarkable. Spleen: 8 mm low-density exophytic from the anterior spleen that is too small for densitometry and likely incidental in isolation. Adrenals/Urinary Tract: Negative adrenals. 2.5 cm right renal mass with  soft tissue densitometry. There may be mild de enhancement on the delayed phase. No hydronephrosis. Unremarkable bladder. Stomach/Bowel:  No obstruction. No appendicitis. Vascular/Lymphatic: No acute vascular abnormality. No mass or adenopathy. Reproductive:There is a 4 cm simple appearing cystic density from the left ovary. Unexpected uterine shape, possible sub septate uterus. The patient had a pregnancy in 2016 and there are tubal ligation clips. Left uterine horn is thicker than the right, and measures up to 11 mm. Other: No ascites or pneumoperitoneum. Musculoskeletal: No acute abnormalities. IMPRESSION: 1. 2.5 cm right renal mass. Recommend nonemergent renal MRI with contrast to differentiate complex cyst and solid neoplasm. 2. No acute finding. No visible bowel inflammation or diverticulosis. 3. 4 cm simple appearing left ovarian cyst. 4. Sub septate appearance of the uterus with asymmetric thicker left uterine horn. Has there been endometrial ablation? Gynecology referral and/or ultrasound could be obtained if clinically needed. Electronically Signed   By: Monte Fantasia M.D.   On: 02/10/2018 14:40    Procedures Procedures (including critical care time)  Medications Ordered in ED Medications  iopamidol (ISOVUE-300) 61 % injection 100 mL (1 mL Intravenous Contrast Given 02/10/18 1433)  sodium chloride 0.9 % injection (   Given 02/10/18 1415)     Initial Impression / Assessment and Plan / ED Course  I have reviewed the triage vital signs and the nursing notes.  Pertinent labs & imaging results that were available during my care of the patient were reviewed by me and considered in my medical decision making (see chart for details).     Pt here for evaluation of abdominal bloating and small volume blood-tinged mucousy stools.  She has a history of diverticulosis but no diverticulitis.  She has a sensation of bloating but no significant tenderness on examination.  CT obtained to evaluate for mass versus diverticulitis.  CT does demonstrate a right renal mass as well as a small left ovarian cyst.  No findings concerning for diverticulitis.  Discussed with patient home care for small volume hematochezia with GI follow-up.  Discussed findings of CT scan and need for further evaluation and further imaging to evaluate renal mass.  Also discussed GYN findings on CT need to follow-up with OB/GYN.  Return precautions discussed.  Final Clinical Impressions(s) / ED Diagnoses   Final diagnoses:  Rectal bleeding    ED Discharge Orders    None       Quintella Reichert, MD 02/10/18 1517

## 2018-02-10 NOTE — ED Triage Notes (Addendum)
Pt reports she has been having bloody diarrhea since 0300 and has had abd bloating. No n/v. Had similar symptoms a year ago, and was diagnosed with diverticulosis. Not on blood thinners.

## 2018-02-10 NOTE — Discharge Instructions (Signed)
You have a 2.5 cm growth on your right kidney.  This will need to be further evaluated with a renal MRI with contrast.  Please follow-up with your family doctor for further workup.  Your CT scan also demonstrated a 4 cm cyst on your left ovary as well as changes to your uterus.  Please follow-up with your OB/GYN for recheck.  Get rechecked immediately if you develop severe pain, massive bleeding or any new concerning symptoms.

## 2018-02-17 DIAGNOSIS — R197 Diarrhea, unspecified: Secondary | ICD-10-CM | POA: Diagnosis not present

## 2018-02-17 DIAGNOSIS — K529 Noninfective gastroenteritis and colitis, unspecified: Secondary | ICD-10-CM | POA: Diagnosis not present

## 2018-02-17 DIAGNOSIS — K625 Hemorrhage of anus and rectum: Secondary | ICD-10-CM | POA: Diagnosis not present

## 2018-02-17 MED FILL — diazePAM 5 MG TABS: 5 | 2 days supply | Qty: 5 | Fill #0

## 2018-02-19 DIAGNOSIS — R162 Hepatomegaly with splenomegaly, not elsewhere classified: Secondary | ICD-10-CM | POA: Diagnosis not present

## 2018-02-19 DIAGNOSIS — N2889 Other specified disorders of kidney and ureter: Secondary | ICD-10-CM | POA: Diagnosis not present

## 2018-02-19 DIAGNOSIS — K760 Fatty (change of) liver, not elsewhere classified: Secondary | ICD-10-CM | POA: Diagnosis not present

## 2018-02-24 MED FILL — OMEPRAZOLE 20 MG CAP: 20 | 90 days supply | Qty: 90 | Fill #1

## 2018-02-24 MED FILL — LEVOTHYROXINE 125 MCG TAB: 125 | 90 days supply | Qty: 90 | Fill #0

## 2018-02-24 MED FILL — FLUTICASONE PROP 50 MCG SPR: 50 | 60 days supply | Qty: 16 | Fill #3

## 2018-02-26 ENCOUNTER — Ambulatory Visit (INDEPENDENT_AMBULATORY_CARE_PROVIDER_SITE_OTHER): Payer: 59 | Admitting: Podiatry

## 2018-02-26 ENCOUNTER — Encounter: Payer: Self-pay | Admitting: Podiatry

## 2018-02-26 DIAGNOSIS — M722 Plantar fascial fibromatosis: Secondary | ICD-10-CM | POA: Diagnosis not present

## 2018-02-26 DIAGNOSIS — R161 Splenomegaly, not elsewhere classified: Secondary | ICD-10-CM | POA: Diagnosis not present

## 2018-02-26 DIAGNOSIS — K625 Hemorrhage of anus and rectum: Secondary | ICD-10-CM | POA: Diagnosis not present

## 2018-02-26 DIAGNOSIS — N2889 Other specified disorders of kidney and ureter: Secondary | ICD-10-CM | POA: Diagnosis not present

## 2018-02-26 DIAGNOSIS — R160 Hepatomegaly, not elsewhere classified: Secondary | ICD-10-CM | POA: Diagnosis not present

## 2018-02-26 DIAGNOSIS — K760 Fatty (change of) liver, not elsewhere classified: Secondary | ICD-10-CM | POA: Diagnosis not present

## 2018-02-26 DIAGNOSIS — N83202 Unspecified ovarian cyst, left side: Secondary | ICD-10-CM | POA: Diagnosis not present

## 2018-02-26 DIAGNOSIS — Z09 Encounter for follow-up examination after completed treatment for conditions other than malignant neoplasm: Secondary | ICD-10-CM | POA: Diagnosis not present

## 2018-02-26 MED ORDER — METHYLPREDNISOLONE 4 MG PO TBPK: ORAL_TABLET | ORAL | 0 refills | Status: DC

## 2018-02-26 NOTE — Progress Notes (Signed)
Subjective: Kathleen Henry presents the office today for concerns of right plantar fasciitis, heel pain.  She has been treated for this previously with Dr. Milinda Pointer.  She was doing well from the injection however the pain is starting to come back.  She denies any recent injury or trauma she denies any swelling.  She has no numbness or tingling.  The pain does not wake her up at night.  She has not had any swelling.  She has no other concerns today. Denies any systemic complaints such as fevers, chills, nausea, vomiting. No acute changes since last appointment, and no other complaints at this time.   Objective: AAO x3, NAD DP/PT pulses palpable bilaterally, CRT less than 3 seconds There is tenderness palpation on the right foot on the plantar medial tubercle of the calcaneus at the insertion of the plantar fascia.  The plantar fascia appears to be intact.  No pain with lateral compression of the calcaneus.  No pain on the Achilles tendon and the Achilles tendon appears to be intact.  No pain to the contralateral extremity.  Negative Tinel sign.  There is no overlying edema, erythema, increase in warmth. No open lesions or pre-ulcerative lesions.  No pain with calf compression, swelling, warmth, erythema  Assessment: Right heel pain, likely plantar fasciitis reoccurrence  Plan: -All treatment options discussed with the patient including all alternatives, risks, complications.  -I reviewed her chart and previous x-rays with her. -Discussed a steroid injection.  She wishes to proceed.  See procedure note below. -Medrol Dosepak prescribed -Discussed stretching, rehab exercises. -Discussed orthotics and she wishes to proceed.  She was molded today by Liliane Channel for custom inserts. -Patient encouraged to call the office with any questions, concerns, change in symptoms.   Procedure: Injection Tendon/Ligament Discussed alternatives, risks, complications and verbal consent was obtained.  Location: Right plantar fascia  at the glabrous junction; medial approach. Skin Prep: Alcohol. Injectate: 1 cc 0.5% marcaine plain, 0.5 cc 0.5% Marcaine plain and, 0.5 cc kenalog 10. Disposition: Patient tolerated procedure well. Injection site dressed with a band-aid.  Post-injection care was discussed and return precautions discussed.   Trula Slade DPM  ------------------  Velora Heckler- Scanned/orderd f/o to address plantar fasc. Dr. Jacqualyn Posey to drop [phone] charges.

## 2018-02-26 NOTE — Patient Instructions (Signed)

## 2018-03-04 DIAGNOSIS — R102 Pelvic and perineal pain: Secondary | ICD-10-CM | POA: Diagnosis not present

## 2018-03-04 DIAGNOSIS — D4101 Neoplasm of uncertain behavior of right kidney: Secondary | ICD-10-CM | POA: Diagnosis not present

## 2018-03-04 DIAGNOSIS — N83292 Other ovarian cyst, left side: Secondary | ICD-10-CM | POA: Diagnosis not present

## 2018-03-17 ENCOUNTER — Ambulatory Visit: Payer: 59 | Admitting: Podiatry

## 2018-03-18 DIAGNOSIS — M25571 Pain in right ankle and joints of right foot: Secondary | ICD-10-CM | POA: Diagnosis not present

## 2018-03-19 ENCOUNTER — Other Ambulatory Visit (HOSPITAL_COMMUNITY): Payer: Self-pay | Admitting: Urology

## 2018-03-19 ENCOUNTER — Ambulatory Visit (HOSPITAL_COMMUNITY)
Admission: RE | Admit: 2018-03-19 | Discharge: 2018-03-19 | Disposition: A | Discharge status: Home / Self Care | Payer: 59 | Source: Ambulatory Visit | Attending: Urology | Admitting: Urology

## 2018-03-19 DIAGNOSIS — Z01811 Encounter for preprocedural respiratory examination: Secondary | ICD-10-CM | POA: Diagnosis not present

## 2018-03-19 DIAGNOSIS — C641 Malignant neoplasm of right kidney, except renal pelvis: Secondary | ICD-10-CM | POA: Diagnosis not present

## 2018-03-19 DIAGNOSIS — D4101 Neoplasm of uncertain behavior of right kidney: Secondary | ICD-10-CM | POA: Insufficient documentation

## 2018-03-19 DIAGNOSIS — Z01818 Encounter for other preprocedural examination: Secondary | ICD-10-CM | POA: Diagnosis not present

## 2018-03-20 ENCOUNTER — Ambulatory Visit: Payer: 59 | Admitting: Podiatry

## 2018-03-23 ENCOUNTER — Ambulatory Visit: Payer: 59 | Admitting: Podiatry

## 2018-03-23 ENCOUNTER — Other Ambulatory Visit: Payer: 59 | Admitting: Orthotics

## 2018-03-23 DIAGNOSIS — M722 Plantar fascial fibromatosis: Secondary | ICD-10-CM

## 2018-03-23 NOTE — Patient Instructions (Signed)

## 2018-03-24 ENCOUNTER — Other Ambulatory Visit: Payer: Self-pay | Admitting: Urology

## 2018-03-26 NOTE — Progress Notes (Signed)
Patient presents today to [facility].  She was doing well she had no issues she did not need to see me today.  Cranford Mon, CMA dispensed the orthotics today in oral and written break instructions were discussed.  Follow-up in 4 weeks or sooner if any issues are to arise she should continue to have pain or if she needs any adjustments in the orthotic she instructed to call the office.  Trula Slade DPM

## 2018-04-13 NOTE — Patient Instructions (Signed)
AYLA DUNIGAN  04/13/2018   Your procedure is scheduled on: 04/17/2018   Report to [facility] Main  Entrance  Report to admitting at      1030 AM    Call this number if you have problems the morning of surgery [phone]   Remember: Do not eat food or drink liquids :After Midnight.     Take these medicines the morning of surgery with A SIP OF WATER: Flonase if needed, Synthroid, Prilosec, Propanolol ( INderal ) if needed                                 You may not have any metal on your body including hair pins and              piercings  Do not wear jewelry, make-up, lotions, powders or perfumes, deodorant             Do not wear nail polish.  Do not shave  48 hours prior to surgery.                Do not bring valuables to the hospital. [place].  Contacts, dentures or bridgework may not be worn into surgery.  Leave suitcase in the car. After surgery it may be brought to your room.                       Please read over the following fact sheets you were given: _____________________________________________________________________             [facility] - Preparing for Surgery Before surgery, you can play an important role.  Because skin is not sterile, your skin needs to be as free of germs as possible.  You can reduce the number of germs on your skin by washing with CHG (chlorahexidine gluconate) soap before surgery.  CHG is an antiseptic cleaner which kills germs and bonds with the skin to continue killing germs even after washing. Please DO NOT use if you have an allergy to CHG or antibacterial soaps.  If your skin becomes reddened/irritated stop using the CHG and inform your nurse when you arrive at Short Stay. Do not shave (including legs and underarms) for at least 48 hours prior to the first CHG shower.  You may shave your face/neck. Please follow these instructions carefully:  1.  Shower  with CHG Soap the night before surgery and the  morning of Surgery.  2.  If you choose to wash your hair, wash your hair first as usual with your  normal  shampoo.  3.  After you shampoo, rinse your hair and body thoroughly to remove the  shampoo.                           4.  Use CHG as you would any other liquid soap.  You can apply chg directly  to the skin and wash                       Gently with a scrungie or clean washcloth.  5.  Apply the CHG Soap to your body ONLY FROM THE NECK DOWN.  Do not use on face/ open                           Wound or open sores. Avoid contact with eyes, ears mouth and genitals (private parts).                       Wash face,  Genitals (private parts) with your normal soap.             6.  Wash thoroughly, paying special attention to the area where your surgery  will be performed.  7.  Thoroughly rinse your body with warm water from the neck down.  8.  DO NOT shower/wash with your normal soap after using and rinsing off  the CHG Soap.                9.  Pat yourself dry with a clean towel.            10.  Wear clean pajamas.            11.  Place clean sheets on your bed the night of your first shower and do not  sleep with pets. Day of Surgery : Do not apply any lotions/deodorants the morning of surgery.  Please wear clean clothes to the hospital/surgery center.  FAILURE TO FOLLOW THESE INSTRUCTIONS MAY RESULT IN THE CANCELLATION OF YOUR SURGERY PATIENT SIGNATURE_________________________________  NURSE SIGNATURE__________________________________  ________________________________________________________________________  WHAT IS A BLOOD TRANSFUSION? Blood Transfusion Information  A transfusion is the replacement of blood or some of its parts. Blood is made up of multiple cells which provide different functions.  Red blood cells carry oxygen and are used for blood loss replacement.  White blood cells fight against infection.  Platelets control  bleeding.  Plasma helps clot blood.  Other blood products are available for specialized needs, such as hemophilia or other clotting disorders. BEFORE THE TRANSFUSION  Who gives blood for transfusions?   Healthy volunteers who are fully evaluated to make sure their blood is safe. This is blood bank blood. Transfusion therapy is the safest it has ever been in the practice of medicine. Before blood is taken from a donor, a complete history is taken to make sure that person has no history of diseases nor engages in risky social behavior (examples are intravenous drug use or sexual activity with multiple partners). The donor's travel history is screened to minimize risk of transmitting infections, such as malaria. The donated blood is tested for signs of infectious diseases, such as HIV and hepatitis. The blood is then tested to be sure it is compatible with you in order to minimize the chance of a transfusion reaction. If you or a relative donates blood, this is often done in anticipation of surgery and is not appropriate for emergency situations. It takes many days to process the donated blood. RISKS AND COMPLICATIONS Although transfusion therapy is very safe and saves many lives, the main dangers of transfusion include:   Getting an infectious disease.  Developing a transfusion reaction. This is an allergic reaction to something in the blood you were given. Every precaution is taken to prevent this. The decision to have a blood transfusion has been considered carefully by your caregiver before blood is given. Blood is not given unless the benefits outweigh the risks. AFTER THE TRANSFUSION  Right after receiving a blood transfusion, you will usually feel much better and more energetic. This is especially  true if your red blood cells have gotten low (anemic). The transfusion raises the level of the red blood cells which carry oxygen, and this usually causes an energy increase.  The nurse  administering the transfusion will monitor you carefully for complications. HOME CARE INSTRUCTIONS  No special instructions are needed after a transfusion. You may find your energy is better. Speak with your caregiver about any limitations on activity for underlying diseases you may have. SEEK MEDICAL CARE IF:   Your condition is not improving after your transfusion.  You develop redness or irritation at the intravenous (IV) site. SEEK IMMEDIATE MEDICAL CARE IF:  Any of the following symptoms occur over the next 12 hours:  Shaking chills.  You have a temperature by mouth above 102 F (38.9 C), not controlled by medicine.  Chest, back, or muscle pain.  People around you feel you are not acting correctly or are confused.  Shortness of breath or difficulty breathing.  Dizziness and fainting.  You get a rash or develop hives.  You have a decrease in urine output.  Your urine turns a dark color or changes to pink, red, or brown. Any of the following symptoms occur over the next 10 days:  You have a temperature by mouth above 102 F (38.9 C), not controlled by medicine.  Shortness of breath.  Weakness after normal activity.  The white part of the eye turns yellow (jaundice).  You have a decrease in the amount of urine or are urinating less often.  Your urine turns a dark color or changes to pink, red, or brown. Document Released: 11/29/2000 Document Revised: 02/24/2012 Document Reviewed: 07/18/2008 [facility] [place] Patient Information 2014 [place], [place].  _______________________________________________________________________

## 2018-04-14 ENCOUNTER — Other Ambulatory Visit: Payer: Self-pay

## 2018-04-14 ENCOUNTER — Encounter (HOSPITAL_COMMUNITY): Payer: Self-pay

## 2018-04-14 ENCOUNTER — Encounter (HOSPITAL_COMMUNITY)
Admission: RE | Admit: 2018-04-14 | Discharge: 2018-04-14 | Disposition: A | Discharge status: Home / Self Care | Payer: 59 | Source: Ambulatory Visit | Attending: Urology | Admitting: Urology

## 2018-04-14 DIAGNOSIS — Z791 Long term (current) use of non-steroidal anti-inflammatories (NSAID): Secondary | ICD-10-CM | POA: Diagnosis not present

## 2018-04-14 DIAGNOSIS — K219 Gastro-esophageal reflux disease without esophagitis: Secondary | ICD-10-CM | POA: Diagnosis not present

## 2018-04-14 DIAGNOSIS — E039 Hypothyroidism, unspecified: Secondary | ICD-10-CM | POA: Diagnosis not present

## 2018-04-14 DIAGNOSIS — Z6841 Body Mass Index (BMI) 40.0 and over, adult: Secondary | ICD-10-CM | POA: Diagnosis not present

## 2018-04-14 DIAGNOSIS — Z7951 Long term (current) use of inhaled steroids: Secondary | ICD-10-CM | POA: Diagnosis not present

## 2018-04-14 DIAGNOSIS — Z79899 Other long term (current) drug therapy: Secondary | ICD-10-CM | POA: Diagnosis not present

## 2018-04-14 DIAGNOSIS — C641 Malignant neoplasm of right kidney, except renal pelvis: Secondary | ICD-10-CM | POA: Diagnosis not present

## 2018-04-14 DIAGNOSIS — Z79890 Hormone replacement therapy (postmenopausal): Secondary | ICD-10-CM | POA: Diagnosis not present

## 2018-04-14 DIAGNOSIS — N2889 Other specified disorders of kidney and ureter: Secondary | ICD-10-CM | POA: Diagnosis present

## 2018-04-14 HISTORY — DX: Nausea with vomiting, unspecified: R11.2

## 2018-04-14 HISTORY — DX: Anemia, unspecified: D64.9

## 2018-04-14 HISTORY — DX: Other specified postprocedural states: Z98.890

## 2018-04-14 HISTORY — DX: Palpitations: R00.2

## 2018-04-14 HISTORY — DX: Gastro-esophageal reflux disease without esophagitis: K21.9

## 2018-04-14 HISTORY — DX: Candidiasis, unspecified: B37.9

## 2018-04-14 LAB — CBC
HCT: 39.9 % (ref 36.0–46.0)
HEMOGLOBIN: 13 g/dL (ref 12.0–15.0)
MCH: 27.8 pg (ref 26.0–34.0)
MCHC: 32.6 g/dL (ref 30.0–36.0)
MCV: 85.4 fL (ref 78.0–100.0)
Platelets: 269 10*3/uL (ref 150–400)
RBC: 4.67 MIL/uL (ref 3.87–5.11)
RDW: 13.3 % (ref 11.5–15.5)
WBC: 7.4 10*3/uL (ref 4.0–10.5)

## 2018-04-14 LAB — BASIC METABOLIC PANEL
ANION GAP: 8 (ref 5–15)
BUN: 10 mg/dL (ref 6–20)
CALCIUM: 8.4 mg/dL — AB (ref 8.9–10.3)
CO2: 25 mmol/L (ref 22–32)
Chloride: 107 mmol/L (ref 101–111)
Creatinine, Ser: 0.8 mg/dL (ref 0.44–1.00)
GFR calc Af Amer: >60 mL/min (ref >60)
Glucose, Bld: 81 mg/dL (ref 65–99)
Potassium: 4.2 mmol/L (ref 3.5–5.1)
Sodium: 140 mmol/L (ref 135–145)

## 2018-04-14 NOTE — Progress Notes (Signed)
CXR-03/19/18-epic

## 2018-04-14 NOTE — Progress Notes (Signed)
Patient reported at preop appt of recent yeast infection at leg that has cleared per patient.  Patient stated she had to take Diflucan and Nystatin cream.  Instructed patient to report to PCP any further problems to PCP and to surgeon.  Patient voiced understanding.

## 2018-04-15 NOTE — H&P (Signed)
Urology Preoperative H&P   Chief Complaint: Right renal mass  History of Present Illness: Kathleen Henry is a 42 y.o. female with a history of an incidentally identified 2.3 cm right renal mass seen on recent CT during an evaluation for hematochezia. She had a subsequent MRI on 02/19/18 that confirmed that the exophytic and heterogenous enhancing right renal mass had features concerning for malignancy. She is here today to discuss surgical/treatment options.   She denies a personal/family history of GU malignancies.  From a urinary standpoint, she has no filling or voiding complaints. She denies a history of urinary tract infections, dysuria, flank pain or hematuria.     Past Medical History:  Diagnosis Date  . AMA (advanced maternal age) multigravida 82+   . Anemia   . Benign cyst of skin 2014   To neck- removed  . GERD (gastroesophageal reflux disease)   . History of PCOS   . Hypothyroidism   . Palpitations    occasional   . PONV (postoperative nausea and vomiting)    with epidurals had " shakes" afterwards   . Sinus tachycardia   . Yeast infection    hx of in creases of legs per patient, most recent 04/10/2018 patient had to take diflucan x 1 and nystatin cream with clearing.     Past Surgical History:  Procedure Laterality Date  . CESAREAN SECTION    . COLONOSCOPY    . DILATION AND CURETTAGE OF UTERUS  12/05/2016  . ENDOMETRIAL ABLATION  12/05/2016  . TUBAL LIGATION  12/05/2016    Allergies:  Allergies  Allergen Reactions  . Aspirin Hives  . Ibuprofen     Causes gi bleeding issues   . Lexapro [Escitalopram] Other (See Comments)    Hot/cold flashes   . Penicillins Rash    Has patient had a PCN reaction causing immediate rash, facial/tongue/throat swelling, SOB or lightheadedness with hypotension: no Has patient had a PCN reaction causing severe rash involving mucus membranes or skin necrosis: no Has patient had a PCN reaction that required hospitalization: no Has  patient had a PCN reaction occurring within the last 10 years: no If all of the above answers are "NO", then may proceed with Cephalosporin use.     Family History  Problem Relation Age of Onset  . Hypertension Mother   . Cancer Mother        colon  . CAD Father   . Hypertension Father   . Diabetes Father        Type 2  . Thyroid disease Father   . Kidney disease Father   . Diabetes Daughter        Type 1  . Cancer Paternal Grandmother        liver  . Cancer Paternal Grandfather        leaukemia    Social History:  reports that she has never smoked. She has never used smokeless tobacco. She reports that she does not drink alcohol or use drugs.  ROS: A complete review of systems was performed.  All systems are negative except for pertinent findings as noted.  Physical Exam:  Vital signs in last 24 hours: Temp:  [98.4 F (36.9 C)] 98.4 F (36.9 C) (04/30 1519) Pulse Rate:  [90] 90 (04/30 1519) Resp:  [16] 16 (04/30 1519) BP: (157)/(104) 157/104 (04/30 1519) SpO2:  [97 %] 97 % (04/30 1519) Weight:  [137.9 kg (304 lb)] 137.9 kg (304 lb) (04/30 1519) Constitutional:  Alert and oriented, No acute  distress Cardiovascular: Regular rate and rhythm, No JVD Respiratory: Normal respiratory effort, Lungs clear bilaterally GI: Abdomen is soft, nontender, nondistended, no abdominal masses GU: No CVA tenderness Lymphatic: No lymphadenopathy Neurologic: Grossly intact, no focal deficits Psychiatric: Normal mood and affect  Laboratory Data:  Recent Labs    04/14/18 1400  WBC 7.4  HGB 13.0  HCT 39.9  PLT 269    Recent Labs    04/14/18 1400  NA 140  K 4.2  CL 107  GLUCOSE 81  BUN 10  CALCIUM 8.4*  CREATININE 0.80     Results for orders placed or performed during the hospital encounter of 04/14/18 (from the past 24 hour(s))  Basic metabolic panel     Status: Abnormal   Collection Time: 04/14/18  2:00 PM  Result Value Ref Range   Sodium 140 135 - 145 mmol/L    Potassium 4.2 3.5 - 5.1 mmol/L   Chloride 107 101 - 111 mmol/L   CO2 25 22 - 32 mmol/L   Glucose, Bld 81 65 - 99 mg/dL   BUN 10 6 - 20 mg/dL   Creatinine, Ser 0.80 0.44 - 1.00 mg/dL   Calcium 8.4 (L) 8.9 - 10.3 mg/dL   GFR calc non Af Amer >60 >60 mL/min   GFR calc Af Amer >60 >60 mL/min   Anion gap 8 5 - 15  CBC     Status: None   Collection Time: 04/14/18  2:00 PM  Result Value Ref Range   WBC 7.4 4.0 - 10.5 K/uL   RBC 4.67 3.87 - 5.11 MIL/uL   Hemoglobin 13.0 12.0 - 15.0 g/dL   HCT 39.9 36.0 - 46.0 %   MCV 85.4 78.0 - 100.0 fL   MCH 27.8 26.0 - 34.0 pg   MCHC 32.6 30.0 - 36.0 g/dL   RDW 13.3 11.5 - 15.5 %   Platelets 269 150 - 400 K/uL  Type and screen All Cardiac and thoracic surgeries, spinal fusions, myomectomies, craniotomies, colon & liver resections, total joint revisions, same day c-section with placenta previa or accreta.     Status: None   Collection Time: 04/14/18  2:00 PM  Result Value Ref Range   ABO/RH(D) A POS    Antibody Screen NEG    Sample Expiration 04/28/2018    Extend sample reason NO TRANSFUSIONS OR PREGNANCY IN THE PAST 3 MONTHS    Unit Number [id number]    Blood Component Type RED CELLS,LR    Unit division 00    Status of Unit REL FROM [facility]    Transfusion Status OK TO TRANSFUSE    Crossmatch Result      Compatible Performed at [facility] [address] [place], [place] [address]    No results found for this or any previous visit (from the past 240 hour(s)).  Renal Function: Recent Labs    04/14/18 1400  CREATININE 0.80   Estimated Creatinine Clearance: 129.8 mL/min (by C-G formula based on SCr of 0.8 mg/dL).  Radiologic Imaging: No results found.  I independently reviewed the above imaging studies.  Assessment and Plan Kathleen Henry is a 42 y.o. female with a 2.3 cm right renal mass with features concerning for malignancy  I reviewed imaging results and films with the patient personally. Discussed that the  mass in question has features concerning for malignancy. Explained the natural history of presumed renal cell carcinoma. Reviewed the AUA guidelines for evaluation and treatment of the small renal mass. Active surveillance, in  situ tumor ablation, partial and radical nephrectomy discussed. The risks of robotic partial nephrectomy were discussed in detail including but not limited to: negative pathology, open conversion, completion nephrectomy, infection of the urinary tract/skin/abdominal cavity, VTE, MI/CVA, lymphatic leak, injury to adjacent solid/hollow viscus organs, bleeding requiring a blood transfusion, catastrophic bleeding, hernia formation, need for postoperative angioembolization, urinary leak requiring stent/drain, and other imponderables.   -The patient voices understanding of the risks, benefits and alternatives and wishes to proceed with robotic-assisted right partial nephrectomy     Ellison Hughs, MD 04/15/2018, 11:03 AM  [facility] Pager: [phone]

## 2018-04-16 NOTE — Progress Notes (Signed)
[place] again ( first call on 04/15/2018) stating that Final 12 lead EKG done 04/14/2018 not visible in epic.  Called EKG dept to have final EKG faxed to me to placed on chart.

## 2018-04-16 NOTE — Progress Notes (Signed)
REcalled again for the final EKG done 04/14/2018 be faxed to [place] from EKG department.  They are to fax.

## 2018-04-16 NOTE — Progress Notes (Signed)
Final EKG done 04/14/18 on chart.

## 2018-04-17 ENCOUNTER — Encounter (HOSPITAL_COMMUNITY)
Admission: RE | Disposition: A | Discharge status: Home / Self Care | Payer: Self-pay | Source: Ambulatory Visit | Attending: Urology

## 2018-04-17 ENCOUNTER — Ambulatory Visit (HOSPITAL_COMMUNITY): Payer: 59 | Admitting: Certified Registered Nurse Anesthetist

## 2018-04-17 ENCOUNTER — Other Ambulatory Visit: Payer: Self-pay

## 2018-04-17 ENCOUNTER — Encounter (HOSPITAL_COMMUNITY): Payer: Self-pay | Admitting: *Deleted

## 2018-04-17 ENCOUNTER — Ambulatory Visit (HOSPITAL_COMMUNITY)
Admission: RE | Admit: 2018-04-17 | Discharge: 2018-04-18 | Disposition: A | Discharge status: Home / Self Care | Payer: 59 | Source: Ambulatory Visit | Attending: Urology | Admitting: Urology

## 2018-04-17 DIAGNOSIS — K219 Gastro-esophageal reflux disease without esophagitis: Secondary | ICD-10-CM | POA: Insufficient documentation

## 2018-04-17 DIAGNOSIS — Z791 Long term (current) use of non-steroidal anti-inflammatories (NSAID): Secondary | ICD-10-CM | POA: Insufficient documentation

## 2018-04-17 DIAGNOSIS — E039 Hypothyroidism, unspecified: Secondary | ICD-10-CM | POA: Diagnosis not present

## 2018-04-17 DIAGNOSIS — C641 Malignant neoplasm of right kidney, except renal pelvis: Secondary | ICD-10-CM | POA: Insufficient documentation

## 2018-04-17 DIAGNOSIS — Z79890 Hormone replacement therapy (postmenopausal): Secondary | ICD-10-CM | POA: Insufficient documentation

## 2018-04-17 DIAGNOSIS — Z79899 Other long term (current) drug therapy: Secondary | ICD-10-CM | POA: Insufficient documentation

## 2018-04-17 DIAGNOSIS — Z7951 Long term (current) use of inhaled steroids: Secondary | ICD-10-CM | POA: Insufficient documentation

## 2018-04-17 DIAGNOSIS — D4101 Neoplasm of uncertain behavior of right kidney: Secondary | ICD-10-CM | POA: Diagnosis not present

## 2018-04-17 DIAGNOSIS — N2889 Other specified disorders of kidney and ureter: Secondary | ICD-10-CM

## 2018-04-17 DIAGNOSIS — Z6841 Body Mass Index (BMI) 40.0 and over, adult: Secondary | ICD-10-CM | POA: Insufficient documentation

## 2018-04-17 HISTORY — PX: ROBOTIC ASSITED PARTIAL NEPHRECTOMY: SHX6087

## 2018-04-17 LAB — TYPE AND SCREEN
ABO/RH(D): A POS
Antibody Screen: NEGATIVE
Unit division: 0

## 2018-04-17 LAB — PREGNANCY, URINE: Preg Test, Ur: NEGATIVE

## 2018-04-17 LAB — BPAM RBC
Blood Product Expiration Date: 201905202359
ISSUE DATE / TIME: 201905011037
Unit Type and Rh: 6200

## 2018-04-17 LAB — HEMOGLOBIN AND HEMATOCRIT, BLOOD
HEMATOCRIT: 41.4 % (ref 36.0–46.0)
Hemoglobin: 13.6 g/dL (ref 12.0–15.0)

## 2018-04-17 SURGERY — NEPHRECTOMY, PARTIAL, ROBOT-ASSISTED
Anesthesia: General | Laterality: Right

## 2018-04-17 MED ORDER — MIDAZOLAM HCL 2 MG/2ML IJ SOLN
INTRAMUSCULAR | Status: AC
  Filled 2018-04-17: qty 2

## 2018-04-17 MED ORDER — HYDROMORPHONE HCL 1 MG/ML IJ SOLN: 0.5000 mg | INTRAMUSCULAR | Status: DC | PRN

## 2018-04-17 MED ORDER — PROPOFOL 10 MG/ML IV BOLUS
INTRAVENOUS | Status: AC
Start: 2018-04-17 — End: ?
  Filled 2018-04-17: qty 20

## 2018-04-17 MED ORDER — SUGAMMADEX SODIUM 200 MG/2ML IV SOLN
INTRAVENOUS | Status: DC | PRN
  Administered 2018-04-17: 300 mg via INTRAVENOUS

## 2018-04-17 MED ORDER — BUPIVACAINE LIPOSOME 1.3 % IJ SUSP
20.0000 mL | Freq: Once | INTRAMUSCULAR | Status: AC
Start: 2018-04-17 — End: 2018-04-17
  Administered 2018-04-17: 20 mL
  Administered 2018-04-17: 266 mg
  Filled 2018-04-17: qty 20

## 2018-04-17 MED ORDER — BUPIVACAINE HCL (PF) 0.5 % IJ SOLN
INTRAMUSCULAR | Status: AC
  Filled 2018-04-17: qty 30

## 2018-04-17 MED ORDER — FENTANYL CITRATE (PF) 100 MCG/2ML IJ SOLN
INTRAMUSCULAR | Status: AC
  Filled 2018-04-17: qty 2

## 2018-04-17 MED ORDER — DEXAMETHASONE SODIUM PHOSPHATE 10 MG/ML IJ SOLN
INTRAMUSCULAR | Status: AC
  Filled 2018-04-17: qty 1

## 2018-04-17 MED ORDER — ONDANSETRON HCL 4 MG PO TABS: 4.0000 mg | ORAL_TABLET | Freq: Every day | ORAL | 1 refills | Status: AC | PRN

## 2018-04-17 MED ORDER — DEXAMETHASONE SODIUM PHOSPHATE 10 MG/ML IJ SOLN
INTRAMUSCULAR | Status: DC | PRN
  Administered 2018-04-17: 10 mg via INTRAVENOUS

## 2018-04-17 MED ORDER — LEVOTHYROXINE SODIUM 25 MCG PO TABS
125.0000 ug | ORAL_TABLET | Freq: Every day | ORAL | Status: DC
  Administered 2018-04-18: 125 ug via ORAL
  Filled 2018-04-17: qty 1

## 2018-04-17 MED ORDER — CLINDAMYCIN PHOSPHATE 900 MG/50ML IV SOLN
900.0000 mg | Freq: Once | INTRAVENOUS | Status: AC
  Administered 2018-04-17: 900 mg via INTRAVENOUS
  Filled 2018-04-17: qty 50

## 2018-04-17 MED ORDER — FENTANYL CITRATE (PF) 250 MCG/5ML IJ SOLN
INTRAMUSCULAR | Status: AC
  Filled 2018-04-17: qty 5

## 2018-04-17 MED ORDER — FENTANYL CITRATE (PF) 100 MCG/2ML IJ SOLN
INTRAMUSCULAR | Status: DC | PRN
  Administered 2018-04-17: 50 ug via INTRAVENOUS
  Administered 2018-04-17: 100 ug via INTRAVENOUS
  Administered 2018-04-17: 50 ug via INTRAVENOUS
  Administered 2018-04-17 (×2): 100 ug via INTRAVENOUS
  Administered 2018-04-17: 50 ug via INTRAVENOUS

## 2018-04-17 MED ORDER — PROPRANOLOL HCL 10 MG PO TABS: 10.0000 mg | ORAL_TABLET | Freq: Two times a day (BID) | ORAL | Status: DC | PRN

## 2018-04-17 MED ORDER — HYDROCODONE-ACETAMINOPHEN 5-325 MG PO TABS: 1.0000 | ORAL_TABLET | ORAL | 0 refills | Status: DC | PRN

## 2018-04-17 MED ORDER — DIPHENHYDRAMINE HCL 12.5 MG/5ML PO ELIX: 12.5000 mg | ORAL_SOLUTION | Freq: Four times a day (QID) | ORAL | Status: DC | PRN

## 2018-04-17 MED ORDER — LIDOCAINE 2% (20 MG/ML) 5 ML SYRINGE
INTRAMUSCULAR | Status: DC | PRN
  Administered 2018-04-17: 100 mg via INTRAVENOUS

## 2018-04-17 MED ORDER — ROCURONIUM BROMIDE 10 MG/ML (PF) SYRINGE
PREFILLED_SYRINGE | INTRAVENOUS | Status: AC
  Filled 2018-04-17: qty 5

## 2018-04-17 MED ORDER — MIDAZOLAM HCL 2 MG/2ML IJ SOLN
INTRAMUSCULAR | Status: DC | PRN
  Administered 2018-04-17: 2 mg via INTRAVENOUS

## 2018-04-17 MED ORDER — SODIUM CHLORIDE 0.9 % IV SOLN
INTRAVENOUS | Status: DC
  Administered 2018-04-17 – 2018-04-18 (×2): via INTRAVENOUS

## 2018-04-17 MED ORDER — LACTATED RINGERS IV SOLN
INTRAVENOUS | Status: DC
  Administered 2018-04-17: 12:00:00 via INTRAVENOUS

## 2018-04-17 MED ORDER — HYDROCODONE-ACETAMINOPHEN 5-325 MG PO TABS: 1.0000 | ORAL_TABLET | ORAL | Status: DC | PRN

## 2018-04-17 MED ORDER — SCOPOLAMINE 1 MG/3DAYS TD PT72
1.0000 | MEDICATED_PATCH | TRANSDERMAL | Status: DC
  Administered 2018-04-17: 1.5 mg via TRANSDERMAL

## 2018-04-17 MED ORDER — FLUTICASONE PROPIONATE 50 MCG/ACT NA SUSP
1.0000 | Freq: Every day | NASAL | Status: DC | PRN
  Filled 2018-04-17: qty 16

## 2018-04-17 MED ORDER — BUPIVACAINE HCL (PF) 0.5 % IJ SOLN
INTRAMUSCULAR | Status: DC | PRN
  Administered 2018-04-17: 15 mL

## 2018-04-17 MED ORDER — DOCUSATE SODIUM 100 MG PO CAPS: 100.0000 mg | ORAL_CAPSULE | Freq: Two times a day (BID) | ORAL | 0 refills | Status: AC | PRN

## 2018-04-17 MED ORDER — KETOROLAC TROMETHAMINE 30 MG/ML IJ SOLN
INTRAMUSCULAR | Status: DC | PRN
  Administered 2018-04-17: 30 mg via INTRAVENOUS

## 2018-04-17 MED ORDER — SCOPOLAMINE 1 MG/3DAYS TD PT72
MEDICATED_PATCH | TRANSDERMAL | Status: AC
  Filled 2018-04-17: qty 1

## 2018-04-17 MED ORDER — ONDANSETRON HCL 4 MG/2ML IJ SOLN: 4.0000 mg | INTRAMUSCULAR | Status: DC | PRN

## 2018-04-17 MED ORDER — SUGAMMADEX SODIUM 500 MG/5ML IV SOLN
INTRAVENOUS | Status: AC
  Filled 2018-04-17: qty 5

## 2018-04-17 MED ORDER — DIPHENHYDRAMINE HCL 50 MG/ML IJ SOLN: 12.5000 mg | Freq: Four times a day (QID) | INTRAMUSCULAR | Status: DC | PRN

## 2018-04-17 MED ORDER — ONDANSETRON HCL 4 MG/2ML IJ SOLN
INTRAMUSCULAR | Status: AC
  Filled 2018-04-17: qty 2

## 2018-04-17 MED ORDER — ACETAMINOPHEN 10 MG/ML IV SOLN
INTRAVENOUS | Status: AC
  Filled 2018-04-17: qty 100

## 2018-04-17 MED ORDER — ROCURONIUM BROMIDE 50 MG/5ML IV SOSY
PREFILLED_SYRINGE | INTRAVENOUS | Status: DC | PRN
  Administered 2018-04-17: 60 mg via INTRAVENOUS
  Administered 2018-04-17: 20 mg via INTRAVENOUS

## 2018-04-17 MED ORDER — DOCUSATE SODIUM 100 MG PO CAPS
100.0000 mg | ORAL_CAPSULE | Freq: Two times a day (BID) | ORAL | Status: DC
  Administered 2018-04-17 – 2018-04-18 (×2): 100 mg via ORAL
  Filled 2018-04-17 (×2): qty 1

## 2018-04-17 MED ORDER — ONDANSETRON HCL 4 MG/2ML IJ SOLN
INTRAMUSCULAR | Status: DC | PRN
  Administered 2018-04-17 (×2): 4 mg via INTRAVENOUS

## 2018-04-17 MED ORDER — CLINDAMYCIN PHOSPHATE 600 MG/50ML IV SOLN
600.0000 mg | Freq: Three times a day (TID) | INTRAVENOUS | Status: DC
  Administered 2018-04-17 – 2018-04-18 (×2): 600 mg via INTRAVENOUS
  Filled 2018-04-17 (×4): qty 50

## 2018-04-17 MED ORDER — FLUCONAZOLE 150 MG PO TABS: 150.0000 mg | ORAL_TABLET | Freq: Every day | ORAL | Status: DC

## 2018-04-17 MED ORDER — PROPOFOL 10 MG/ML IV BOLUS
INTRAVENOUS | Status: AC
  Filled 2018-04-17: qty 20

## 2018-04-17 MED ORDER — LIDOCAINE 2% (20 MG/ML) 5 ML SYRINGE
INTRAMUSCULAR | Status: AC
  Filled 2018-04-17: qty 5

## 2018-04-17 MED ORDER — KETOROLAC TROMETHAMINE 30 MG/ML IJ SOLN
INTRAMUSCULAR | Status: AC
  Filled 2018-04-17: qty 1

## 2018-04-17 MED ORDER — PROPOFOL 10 MG/ML IV BOLUS
INTRAVENOUS | Status: DC | PRN
  Administered 2018-04-17: 200 mg via INTRAVENOUS

## 2018-04-17 MED ORDER — ACETAMINOPHEN 10 MG/ML IV SOLN
INTRAVENOUS | Status: DC | PRN
  Administered 2018-04-17: 1000 mg via INTRAVENOUS

## 2018-04-17 MED ORDER — PANTOPRAZOLE SODIUM 40 MG PO TBEC
40.0000 mg | DELAYED_RELEASE_TABLET | Freq: Every day | ORAL | Status: DC
  Administered 2018-04-18: 40 mg via ORAL
  Filled 2018-04-17: qty 1

## 2018-04-17 MED ORDER — OXYBUTYNIN CHLORIDE 5 MG PO TABS: 5.0000 mg | ORAL_TABLET | Freq: Three times a day (TID) | ORAL | Status: DC | PRN

## 2018-04-17 MED ORDER — EVICEL 5 ML EX KIT
PACK | Freq: Once | CUTANEOUS | Status: AC
  Administered 2018-04-17: 1
  Administered 2018-04-17: 17:00:00
  Filled 2018-04-17: qty 1

## 2018-04-17 SURGICAL SUPPLY — 73 items
AGENT HMST KT MTR STRL THRMB (HEMOSTASIS)
APL ESCP 34 STRL LF DISP (HEMOSTASIS)
APPLICATOR SURGIFLO ENDO (HEMOSTASIS) IMPLANT
BAG SPEC RTRVL LRG 6X4 10 (ENDOMECHANICALS) ×1
CHLORAPREP W/TINT 26ML (MISCELLANEOUS) ×3 IMPLANT
CLIP SUT LAPRA TY ABSORB (SUTURE) ×3 IMPLANT
CLIP VESOCCLUDE MED LG 6/CT (CLIP) ×2 IMPLANT
CLIP VESOLOCK LG 6/CT PURPLE (CLIP) ×3 IMPLANT
CLIP VESOLOCK MED LG 6/CT (CLIP) ×8 IMPLANT
CLIP VESOLOCK XL 6/CT (CLIP) IMPLANT
COVER SURGICAL LIGHT HANDLE (MISCELLANEOUS) ×3 IMPLANT
COVER TIP SHEARS 8 DVNC (MISCELLANEOUS) ×1 IMPLANT
COVER TIP SHEARS 8MM DA VINCI (MISCELLANEOUS) ×2
DECANTER SPIKE VIAL GLASS SM (MISCELLANEOUS) ×1 IMPLANT
DISSECTOR BLUNT TIP ENDO 5MM (MISCELLANEOUS) ×2 IMPLANT
DRAIN CHANNEL 15F RND FF 3/16 (WOUND CARE) IMPLANT
DRAPE ARM DVNC X/XI (DISPOSABLE) ×4 IMPLANT
DRAPE COLUMN DVNC XI (DISPOSABLE) ×1 IMPLANT
DRAPE DA VINCI XI ARM (DISPOSABLE) ×8
DRAPE DA VINCI XI COLUMN (DISPOSABLE) ×2
DRAPE INCISE IOBAN 66X45 STRL (DRAPES) ×3 IMPLANT
DRAPE LAPAROSCOPIC ABDOMINAL (DRAPES) ×2 IMPLANT
DRAPE SHEET LG 3/4 BI-LAMINATE (DRAPES) ×3 IMPLANT
ELECT PENCIL ROCKER SW 15FT (MISCELLANEOUS) ×3 IMPLANT
ELECT REM PT RETURN 15FT ADLT (MISCELLANEOUS) ×3 IMPLANT
EVACUATOR SILICONE 100CC (DRAIN) IMPLANT
GLOVE BIO SURGEON STRL SZ 6.5 (GLOVE) ×2 IMPLANT
GLOVE BIO SURGEONS STRL SZ 6.5 (GLOVE) ×1
GLOVE BIOGEL M STRL SZ7.5 (GLOVE) ×6 IMPLANT
GOWN STRL REUS W/TWL LRG LVL3 (GOWN DISPOSABLE) ×3 IMPLANT
GOWN STRL REUS W/TWL XL LVL3 (GOWN DISPOSABLE) ×6 IMPLANT
HEMOSTAT SURGICEL 4X8 (HEMOSTASIS) ×2 IMPLANT
IRRIG SUCT STRYKERFLOW 2 WTIP (MISCELLANEOUS) ×3
IRRIGATION SUCT STRKRFLW 2 WTP (MISCELLANEOUS) ×1 IMPLANT
KIT BASIN OR (CUSTOM PROCEDURE TRAY) ×3 IMPLANT
MARKER SKIN DUAL TIP RULER LAB (MISCELLANEOUS) ×3 IMPLANT
NDL INSUFFLATION 14GA 120MM (NEEDLE) ×1 IMPLANT
NEEDLE INSUFFLATION 120MM (ENDOMECHANICALS) ×3 IMPLANT
NEEDLE INSUFFLATION 14GA 120MM (NEEDLE) ×3 IMPLANT
PORT ACCESS TROCAR AIRSEAL 12 (TROCAR) ×1 IMPLANT
PORT ACCESS TROCAR AIRSEAL 5M (TROCAR) ×2
POSITIONER SURGICAL ARM (MISCELLANEOUS) ×6 IMPLANT
POUCH SPECIMEN RETRIEVAL 10MM (ENDOMECHANICALS) ×3 IMPLANT
RELOAD STAPLE 60 2.6 WHT THN (STAPLE) IMPLANT
RELOAD STAPLER WHITE 60MM (STAPLE) IMPLANT
SEAL CANN UNIV 5-8 DVNC XI (MISCELLANEOUS) ×3 IMPLANT
SEAL XI 5MM-8MM UNIVERSAL (MISCELLANEOUS) ×6
SET TRI-LUMEN FLTR TB AIRSEAL (TUBING) ×3 IMPLANT
SOLUTION ELECTROLUBE (MISCELLANEOUS) ×3 IMPLANT
STAPLE ECHEON FLEX 60 POW ENDO (STAPLE) IMPLANT
STAPLER RELOAD WHITE 60MM (STAPLE)
STAPLER VISISTAT 35W (STAPLE) ×3 IMPLANT
SURGIFLO W/THROMBIN 8M KIT (HEMOSTASIS) IMPLANT
SUT ETHILON 2 0 PSLX (SUTURE) IMPLANT
SUT MNCRL AB 4-0 PS2 18 (SUTURE) ×4 IMPLANT
SUT PDS AB 0 CT1 36 (SUTURE) ×6 IMPLANT
SUT V-LOC BARB 180 2/0GR9 GS23 (SUTURE)
SUT VIC AB 1 CT1 27 (SUTURE) ×6
SUT VIC AB 1 CT1 27XBRD ANTBC (SUTURE) ×1 IMPLANT
SUT VIC AB 3-0 SH 27 (SUTURE) ×9
SUT VIC AB 3-0 SH 27X BRD (SUTURE) IMPLANT
SUT VIC AB 3-0 SH 27XBRD (SUTURE) IMPLANT
SUT VICRYL 0 UR6 27IN ABS (SUTURE) ×5 IMPLANT
SUT VLOC BARB 180 ABS3/0GR12 (SUTURE) ×9
SUTURE V-LC BRB 180 2/0GR9GS23 (SUTURE) IMPLANT
SUTURE VLOC BRB 180 ABS3/0GR12 (SUTURE) ×1 IMPLANT
TIP RIGID 35CM EVICEL (HEMOSTASIS) ×3 IMPLANT
TOWEL OR 17X26 10 PK STRL BLUE (TOWEL DISPOSABLE) ×3 IMPLANT
TOWEL OR NON WOVEN STRL DISP B (DISPOSABLE) ×3 IMPLANT
TRAY FOLEY W/METER SILVER 16FR (SET/KITS/TRAYS/PACK) ×3 IMPLANT
TRAY LAPAROSCOPIC (CUSTOM PROCEDURE TRAY) ×3 IMPLANT
TROCAR BLADELESS OPT 5 100 (ENDOMECHANICALS) ×2 IMPLANT
WATER STERILE IRR 1000ML POUR (IV SOLUTION) ×3 IMPLANT

## 2018-04-17 NOTE — Anesthesia Postprocedure Evaluation (Signed)
Anesthesia Post Note  Patient: Kathleen Henry  Procedure(s) Performed: XI ROBOTIC ASSITED PARTIAL NEPHRECTOMY (Right )     Patient location during evaluation: PACU Anesthesia Type: General Level of consciousness: awake and alert Pain management: pain level controlled Vital Signs Assessment: post-procedure vital signs reviewed and stable Respiratory status: spontaneous breathing, nonlabored ventilation, respiratory function stable and patient connected to nasal cannula oxygen Cardiovascular status: blood pressure returned to baseline and stable Postop Assessment: no apparent nausea or vomiting Anesthetic complications: no    Last Vitals:  Vitals:   04/17/18 1700 04/17/18 1712  BP: 131/89 (!) 143/86  Pulse: 70 65  Resp: 16 18  Temp: 36.8 C 36.7 C  SpO2: 100% 100%    Last Pain:  Vitals:   04/17/18 1700  TempSrc:   PainSc: 2                  Barnet Glasgow

## 2018-04-17 NOTE — Progress Notes (Signed)
RN notified that patient was c/o burning and felt like she had to urinate.  This RN checked catheter placement and found catheter was removed with the balloon inflated. Dr. Karsten Ro, MD paged. Will carry out any new orders and continue to monitor pt closely. Carnella Guadalajara I

## 2018-04-17 NOTE — Op Note (Signed)
Operative Note  Preoperative diagnosis:  1.  2.3 cm right renal mass   Postoperative diagnosis: 1.  2.3 cm right renal mass  Procedure(s): 1.  Robotic assisted laparoscopic right partial nephrectomy  Surgeon: Ellison Hughs, MD  Assistants:  Burman Nieves, MD  An assistant was required for this surgical procedure.  The duties of the assistant included but were not limited to suctioning, passing suture, camera manipulation, retraction.  This procedure would not be able to be performed without an Environmental consultant.    Anesthesia:  General endotracheal  Complications:  None  EBL:  100 mL  Specimens: 1. Right renal mass  Drains/Catheters: 1.  Foley catheter  Intraoperative findings:   1. Exophytic right renal mass was excised with grossly negative surgical margins 2. Renorrhaphy was hemostatic at the conclusion of the case  Indication:  Kathleen Henry is a 42 y.o. female with with a history of an incidentally identified 2.3 cm right renal mass seen on recent CT during an evaluation for hematochezia. She had a subsequent MRI on 02/19/18 that confirmed that the exophytic and heterogenous enhancing right renal mass had features concerning for malignancy. She is here today for the above procedures.  She voices understanding of the risk, benefits and alternatives of robotic assisted right partial nephrectomy and wishes to proceed.  Description of procedure:  After informed consent was signed, the patient was taken back to the operating room and properly anesthetized.  The patient was then placed in the left lateral decubitus position with all pressure points padded.  The abdomen was then prepped and draped in the usual sterile fashion.  A time-out was then performed.    An 8 mm incision was then made lateral to the right rectus muscle at the level of the right 12th rib.  A Veress needle was then used to access the abdominal cavity.  A saline drop test showed no signs of obstruction and aspiration  of the Veress needle revealed no blood or sucus.  The abdominal cavity was then insufflated to 15 mmHg.  An 8 mm robotic trocar was then atraumatically inserted into the abdominal cavity.  The robotic camera was then inserted through the port and inspection of the abdominal cavity revealed no evidence of adjacent organ or vessel injury.  We then placed three additional 8 mm robotic ports, a 15 mm assistant port and a 5 mm subxiphoid port in such as fashion as to triangulate the right renal hilum.  The robot was then docked into postion.   Using a combination of blunt and cold scissors dissection, the hepatic attachments were released from the abdominal sidewall.  A locking grasper was then inserted through the 5 mm sub-xyphoid port and used to retract the posterior surface of the liver more cephalad.  The white line of Toldt along the ascending colon was then incised, allowing [place] to reflect the colon medially and expose the anterior surface of the right kidney.  The duodenum was then Kocherized medially, which abruptly led [place] to identification of the inferior vena cava.    Once the colon was adequately mobilized, we moved to the lower pole and identified the gonadal vein and ureter.  The gonadal vein was then left running parallel to the vena cava and the right ureter was reflected anteriorly.  Using cautious cautery, the overlying perihilar attachments were then released.  This yielded visualization of the renal hilum, which included a single right renal vein and a single right renal artery.  The perilymphatic tissue surrounding  the right renal artery were carefully released so that the right renal artery was fully encircled.    We next turned our attention to defatting the kidney.  An anterior incision along Gerota's fascia was created and the kidney was fully mobilized.   The renal mass is identified at the anterior mid pole.  Using intraoperative ultrasound, the tumor was then carefully evaluated and  demonstrated heterogenous echogenicity compared to the rest of the renal parenchyma. The resection margin was marked to allow for wide excision of the renal mass.  The renal hilum was once again identified and a bulldog clamp was placed.    Using cold scissors, the right renal mass was then carefully excised, leaving a grossly negative margin.  Excision of the mass appeared complete with no tumor grossly remaining.  The tumor was then placed in the right lower quadrant, to be retrieved following repair of the renal defect.  A running 2-0 strata fix suture was then used to reapproximate the resection bed.  Tension was placed with hemo-lock clips.   The right renal capsule was then reapproximated using a 0 Vicryl suture on a CT1 needle in an interrupted  configuration, using hemo-lock clips as a buttress.  The bulldog was then released, which warm ischemia time totaled 20 minutes.   Once hemostasis was achieved and the renal bed was irrigated, fibrin snow and EVICEL was then placed over the renorrhaphy.  Gerota's fascia was then reapproximated with a running v-lok suture and the mesocolonic fat along the descending colon was then reapproximated to the right abdominal sidewall using Hem-o-lok clips.  The renal mass was retrieved and placed in an Endo Catch bag.  The mass was extracted through the 15 mm assistant port.  The fascia of the assistant port was then reapproximated with a 0 Vicryl suture.    The abdomen was desufflated with all ports removed.  The skin was then reapproximated using 4-0 Monocryl and dressed with dermabond.  The patient was then replaced in the supine position and was awakened from anesthesia without complications.  Warm Ischemia Time: 20 minutes RENAL Score: 6a  Plan: Bedrest until the morning of postop day 1.  DC Foley catheter in the morning of postop day 1.  Start subcutaneous heparin postop day 1.  Advance diet as tolerated.  Follow-up in 2 weeks

## 2018-04-17 NOTE — Interval H&P Note (Signed)
History and Physical Interval Note:  04/17/2018 1:16 PM  Kathleen Henry  has presented today for surgery, with the diagnosis of RIGHT RENAL MASS  The various methods of treatment have been discussed with the patient and family. After consideration of risks, benefits and other options for treatment, the patient has consented to  Procedure(s): XI ROBOTIC ASSITED PARTIAL NEPHRECTOMY (Right) as a surgical intervention .  The patient's history has been reviewed, patient examined, no change in status, stable for surgery.  I have reviewed the patient's chart and labs.  Questions were answered to the patient's satisfaction.     Conception Oms [name]

## 2018-04-17 NOTE — Anesthesia Procedure Notes (Signed)
Procedure Name: Intubation Date/Time: 04/17/2018 1:28 PM Performed by: Genelle Bal, CRNA Pre-anesthesia Checklist: Patient identified, Emergency Drugs available, Suction available and Patient being monitored Patient Re-evaluated:Patient Re-evaluated prior to induction Oxygen Delivery Method: Circle system utilized Preoxygenation: Pre-oxygenation with 100% oxygen Induction Type: IV induction Ventilation: Mask ventilation without difficulty Laryngoscope Size: Miller and 2 Grade View: Grade I Tube type: Oral Tube size: 7.0 mm Number of attempts: 1 Airway Equipment and Method: Stylet and Oral airway Placement Confirmation: ETT inserted through vocal cords under direct vision,  positive ETCO2 and breath sounds checked- equal and bilateral Secured at: 21 cm Tube secured with: Tape Dental Injury: Teeth and Oropharynx as per pre-operative assessment

## 2018-04-17 NOTE — Discharge Instructions (Signed)

## 2018-04-17 NOTE — Transfer of Care (Signed)
Immediate Anesthesia Transfer of Care Note  Patient: Kathleen Henry  Procedure(s) Performed: XI ROBOTIC ASSITED PARTIAL NEPHRECTOMY (Right )  Patient Location: PACU  Anesthesia Type:General  Level of Consciousness: awake, alert  and oriented  Airway & Oxygen Therapy: Patient Spontanous Breathing and Patient connected to face mask oxygen  Post-op Assessment: Report given to RN and Post -op Vital signs reviewed and stable  Post vital signs: Reviewed and stable  Last Vitals:  Vitals Value Taken Time  BP 98/85 04/17/2018  4:31 PM  Temp    Pulse 92 04/17/2018  4:33 PM  Resp 14 04/17/2018  4:33 PM  SpO2 100 % 04/17/2018  4:33 PM  Vitals shown include unvalidated device data.  Last Pain:  Vitals:   04/17/18 1033  TempSrc: Oral      Patients Stated Pain Goal: 3 (74/14/23 9532)  Complications: No apparent anesthesia complications

## 2018-04-17 NOTE — Anesthesia Preprocedure Evaluation (Addendum)
Anesthesia Evaluation  Patient identified by MRN, date of birth, ID band Patient awake    Reviewed: Allergy & Precautions, NPO status , Patient's Chart, lab work & pertinent test results  History of Anesthesia Complications (+) PONV  Airway Mallampati: II  TM Distance: >3 FB Neck ROM: Full    Dental no notable dental hx.    Pulmonary neg pulmonary ROS,    Pulmonary exam normal breath sounds clear to auscultation       Cardiovascular Exercise Tolerance: Good negative cardio ROS Normal cardiovascular exam Rhythm:Regular Rate:Normal     Neuro/Psych negative neurological ROS  negative psych ROS   GI/Hepatic Neg liver ROS,   Endo/Other  Hypothyroidism Morbid obesity  Renal/GU   negative genitourinary   Musculoskeletal negative musculoskeletal ROS (+)   Abdominal (+) + obese,   Peds  Hematology   Anesthesia Other Findings   Reproductive/Obstetrics negative OB ROS                            Lab Results  Component Value Date   CREATININE 0.80 04/14/2018   BUN 10 04/14/2018   NA 140 04/14/2018   K 4.2 04/14/2018   CL 107 04/14/2018   CO2 25 04/14/2018    Lab Results  Component Value Date   WBC 7.4 04/14/2018   HGB 13.0 04/14/2018   HCT 39.9 04/14/2018   MCV 85.4 04/14/2018   PLT 269 04/14/2018    Anesthesia Physical Anesthesia Plan  ASA: III  Anesthesia Plan: General   Post-op Pain Management:    Induction: Intravenous  PONV Risk Score and Plan: 3 and 4 or greater and Treatment may vary due to age or medical condition, Ondansetron, Dexamethasone and Scopolamine patch - Pre-op  Airway Management Planned: Oral ETT  Additional Equipment:   Intra-op Plan:   Post-operative Plan: Extubation in OR  Informed Consent: I have reviewed the patients History and Physical, chart, labs and discussed the procedure including the risks, benefits and alternatives for the proposed  anesthesia with the patient or authorized representative who has indicated his/her understanding and acceptance.   Dental advisory given  Plan Discussed with: CRNA and Anesthesiologist  Anesthesia Plan Comments:         Anesthesia Quick Evaluation

## 2018-04-18 ENCOUNTER — Encounter (HOSPITAL_COMMUNITY): Payer: Self-pay | Admitting: Urology

## 2018-04-18 DIAGNOSIS — C641 Malignant neoplasm of right kidney, except renal pelvis: Secondary | ICD-10-CM | POA: Diagnosis not present

## 2018-04-18 DIAGNOSIS — Z79890 Hormone replacement therapy (postmenopausal): Secondary | ICD-10-CM | POA: Diagnosis not present

## 2018-04-18 DIAGNOSIS — E039 Hypothyroidism, unspecified: Secondary | ICD-10-CM | POA: Diagnosis not present

## 2018-04-18 DIAGNOSIS — Z6841 Body Mass Index (BMI) 40.0 and over, adult: Secondary | ICD-10-CM | POA: Diagnosis not present

## 2018-04-18 DIAGNOSIS — K219 Gastro-esophageal reflux disease without esophagitis: Secondary | ICD-10-CM | POA: Diagnosis not present

## 2018-04-18 DIAGNOSIS — Z7951 Long term (current) use of inhaled steroids: Secondary | ICD-10-CM | POA: Diagnosis not present

## 2018-04-18 DIAGNOSIS — Z79899 Other long term (current) drug therapy: Secondary | ICD-10-CM | POA: Diagnosis not present

## 2018-04-18 DIAGNOSIS — Z791 Long term (current) use of non-steroidal anti-inflammatories (NSAID): Secondary | ICD-10-CM | POA: Diagnosis not present

## 2018-04-18 LAB — BASIC METABOLIC PANEL
ANION GAP: 11 (ref 5–15)
BUN: 9 mg/dL (ref 6–20)
CALCIUM: 8.4 mg/dL — AB (ref 8.9–10.3)
CO2: 21 mmol/L — AB (ref 22–32)
Chloride: 102 mmol/L (ref 101–111)
Creatinine, Ser: 1.03 mg/dL — ABNORMAL HIGH (ref 0.44–1.00)
GFR calc Af Amer: >60 mL/min (ref >60)
GLUCOSE: 124 mg/dL — AB (ref 65–99)
Potassium: 3.9 mmol/L (ref 3.5–5.1)
Sodium: 134 mmol/L — ABNORMAL LOW (ref 135–145)

## 2018-04-18 LAB — HEMOGLOBIN AND HEMATOCRIT, BLOOD
HEMATOCRIT: 38.8 % (ref 36.0–46.0)
HEMOGLOBIN: 12.5 g/dL (ref 12.0–15.0)

## 2018-04-18 MED ORDER — ACETAMINOPHEN 500 MG PO TABS
1000.0000 mg | ORAL_TABLET | Freq: Four times a day (QID) | ORAL | Status: DC | PRN
  Administered 2018-04-18: 1000 mg via ORAL
  Filled 2018-04-18: qty 2

## 2018-04-18 NOTE — Discharge Summary (Signed)
Physician Discharge Summary      Patient ID: Kathleen Henry MRN: [id number] DOB/AGE: [birth year] 42 y.o.  Admit date: 04/17/2018 Discharge date: 04/18/2018  Admission Diagnoses: RIGHT RENAL MASS  Discharge Diagnoses:  Active Problems:   Right renal mass   Discharged Condition: good  Hospital Course: She was admitted and underwent an elective right partial nephrectomy without complication.  Overnight observation revealed she did well.  She has minimal pain.  Her H&H is down slightly but there is no clinical evidence of bleeding.  This is likely from operative blood loss and hydration.  She is tolerating regular diet with no nausea or vomiting.  Her Foley catheter has been removed.  Her urine is still slightly pink but has no clots.  We discussed maintaining a very low activity level at home for the next 24-48 hours.  She is otherwise felt ready for discharge at this time.  Significant Diagnostic Studies: No results found.  Discharge Exam: Blood pressure (!) 145/95, pulse 78, temperature 98.5 F (36.9 C), temperature source Oral, resp. rate 18, height 5\' 6"  (1.676 m), weight (!) 137.9 kg (304 lb), SpO2 97 %, unknown if currently breastfeeding.  General: Alert, oriented and in no apparent distress. Chest: Normal respiratory effort Cardiovascular: Regular rate and rhythm Abdomen: Soft, obese, minimal tenderness in the right upper quadrant with port sites that are free of discharge, erythema or ecchymoses.   Disposition: Discharge disposition: 01-Home or Self Care       Discharge Instructions    Discharge patient   Complete by:  As directed    Discharge disposition:  01-Home or Self Care   Discharge patient date:  04/18/2018     Allergies as of 04/18/2018      Reactions   Aspirin Hives   Ibuprofen    Causes gi bleeding issues    Lexapro [escitalopram] Other (See Comments)   Hot/cold flashes    Penicillins Rash   Has patient had a PCN reaction causing immediate rash,  facial/tongue/throat swelling, SOB or lightheadedness with hypotension: no Has patient had a PCN reaction causing severe rash involving mucus membranes or skin necrosis: no Has patient had a PCN reaction that required hospitalization: no Has patient had a PCN reaction occurring within the last 10 years: no If all of the above answers are "NO", then may proceed with Cephalosporin use.      Medication List    TAKE these medications   docusate sodium 100 MG capsule Commonly known as:  COLACE Take 1 capsule (100 mg total) by mouth 2 (two) times daily as needed for mild constipation.   fluconazole 100 MG tablet Commonly known as:  DIFLUCAN Take 150 mg by mouth daily.   fluticasone 50 MCG/ACT nasal spray Commonly known as:  FLONASE Place 1 spray into both nostrils daily as needed for allergies or rhinitis.   HYDROcodone-acetaminophen 5-325 MG tablet Commonly known as:  NORCO Take 1 tablet by mouth every 4 (four) hours as needed for moderate pain.   ibuprofen 200 MG tablet Commonly known as:  ADVIL,MOTRIN Take 200-600 mg by mouth every 6 (six) hours as needed for headache or moderate pain.   levothyroxine 125 MCG tablet Commonly known as:  SYNTHROID, LEVOTHROID Take 125 mcg by mouth daily before breakfast.   nystatin cream Commonly known as:  MYCOSTATIN Apply 1 application topically daily as needed for itching.   omeprazole 20 MG capsule Commonly known as:  PRILOSEC Take 20 mg by mouth daily.   ondansetron 4 MG tablet  Commonly known as:  ZOFRAN Take 1 tablet (4 mg total) by mouth daily as needed for nausea or vomiting.   prenatal multivitamin Tabs tablet Take 1 tablet by mouth daily.   PROBIOTIC DAILY PO Take 1 capsule by mouth daily.   propranolol 10 MG tablet Commonly known as:  INDERAL Take 10 mg by mouth 2 (two) times daily as needed (heart palpations- patient states she has not taken in over a year).   VITAMIN D (CHOLECALCIFEROL) PO Take 5,000 Units by mouth  daily.      Follow-up Information    Ceasar Mons, MD In 2 weeks.   Specialty:  Urology Contact information: [address] [place] [address] [phone]           Signed: Claybon Jabs 04/18/2018, 7:05 AM

## 2018-04-18 NOTE — Progress Notes (Signed)
Provider contacted to advance diet to regular.

## 2018-04-18 NOTE — Progress Notes (Signed)
Patient remains a&ox4, ambulatory without assistance. Discharge instructions reviewed, hard prescriptions given. Pt denied additional questions and or concerns.

## 2018-04-18 NOTE — Progress Notes (Signed)
No changes from am assessment. Pt is a&o, ambulatory without assist. Pt has tolerated regular diet and denies N/V and or pain. Pt has also voided x 2. Will provide education and discharge.

## 2018-04-20 ENCOUNTER — Ambulatory Visit: Payer: 59 | Admitting: Podiatry

## 2018-04-22 ENCOUNTER — Emergency Department (HOSPITAL_BASED_OUTPATIENT_CLINIC_OR_DEPARTMENT_OTHER): Payer: 59

## 2018-04-22 ENCOUNTER — Encounter (HOSPITAL_BASED_OUTPATIENT_CLINIC_OR_DEPARTMENT_OTHER): Payer: Self-pay | Admitting: Emergency Medicine

## 2018-04-22 ENCOUNTER — Emergency Department (HOSPITAL_BASED_OUTPATIENT_CLINIC_OR_DEPARTMENT_OTHER)
Admission: EM | Admit: 2018-04-22 | Discharge: 2018-04-22 | Disposition: A | Discharge status: Home / Self Care | Payer: 59 | Attending: Emergency Medicine | Admitting: Emergency Medicine

## 2018-04-22 ENCOUNTER — Other Ambulatory Visit: Payer: Self-pay

## 2018-04-22 DIAGNOSIS — Z79899 Other long term (current) drug therapy: Secondary | ICD-10-CM | POA: Insufficient documentation

## 2018-04-22 DIAGNOSIS — E039 Hypothyroidism, unspecified: Secondary | ICD-10-CM | POA: Diagnosis not present

## 2018-04-22 DIAGNOSIS — R002 Palpitations: Secondary | ICD-10-CM | POA: Diagnosis not present

## 2018-04-22 DIAGNOSIS — R000 Tachycardia, unspecified: Secondary | ICD-10-CM | POA: Diagnosis not present

## 2018-04-22 DIAGNOSIS — R7989 Other specified abnormal findings of blood chemistry: Secondary | ICD-10-CM | POA: Diagnosis not present

## 2018-04-22 DIAGNOSIS — R0602 Shortness of breath: Secondary | ICD-10-CM | POA: Diagnosis not present

## 2018-04-22 LAB — CBC WITH DIFFERENTIAL/PLATELET
BASOS ABS: 0 10*3/uL (ref 0.0–0.1)
BASOS PCT: 0 %
EOS PCT: 2 %
Eosinophils Absolute: 0.2 10*3/uL (ref 0.0–0.7)
HEMATOCRIT: 34.8 % — AB (ref 36.0–46.0)
Hemoglobin: 11.7 g/dL — ABNORMAL LOW (ref 12.0–15.0)
Lymphocytes Relative: 18 %
Lymphs Abs: 1.8 10*3/uL (ref 0.7–4.0)
MCH: 28.3 pg (ref 26.0–34.0)
MCHC: 33.6 g/dL (ref 30.0–36.0)
MCV: 84.1 fL (ref 78.0–100.0)
MONO ABS: 0.6 10*3/uL (ref 0.1–1.0)
MONOS PCT: 6 %
Neutro Abs: 7.3 10*3/uL (ref 1.7–7.7)
Neutrophils Relative %: 74 %
PLATELETS: 270 10*3/uL (ref 150–400)
RBC: 4.14 MIL/uL (ref 3.87–5.11)
RDW: 13.1 % (ref 11.5–15.5)
WBC: 9.8 10*3/uL (ref 4.0–10.5)

## 2018-04-22 LAB — BASIC METABOLIC PANEL
ANION GAP: 8 (ref 5–15)
BUN: 10 mg/dL (ref 6–20)
CALCIUM: 8.5 mg/dL — AB (ref 8.9–10.3)
CO2: 22 mmol/L (ref 22–32)
Chloride: 107 mmol/L (ref 101–111)
Creatinine, Ser: 0.87 mg/dL (ref 0.44–1.00)
GFR calc Af Amer: >60 mL/min (ref >60)
GLUCOSE: 124 mg/dL — AB (ref 65–99)
Potassium: 3.6 mmol/L (ref 3.5–5.1)
Sodium: 137 mmol/L (ref 135–145)

## 2018-04-22 LAB — D-DIMER, QUANTITATIVE: D-Dimer, Quant: 1.11 ug/mL-FEU — ABNORMAL HIGH (ref 0.00–0.50)

## 2018-04-22 LAB — TROPONIN I

## 2018-04-22 MED ORDER — ACETAMINOPHEN 500 MG PO TABS
1000.0000 mg | ORAL_TABLET | Freq: Once | ORAL | Status: AC
  Administered 2018-04-22: 1000 mg via ORAL
  Filled 2018-04-22: qty 2

## 2018-04-22 MED ORDER — IOPAMIDOL (ISOVUE-370) INJECTION 76%
100.0000 mL | Freq: Once | INTRAVENOUS | Status: AC | PRN
  Administered 2018-04-22: 100 mL via INTRAVENOUS

## 2018-04-22 NOTE — ED Provider Notes (Signed)
[facility] EMERGENCY DEPARTMENT Provider Note   CSN: [id number] Arrival date & time: 04/22/18  0203     History   Chief Complaint Chief Complaint  Patient presents with  . Tachycardia    HPI Kathleen Henry is a 42 y.o. female.  The history is provided by the patient.  Palpitations   This is a new problem. The problem occurs constantly. The problem has been gradually improving. Associated symptoms include shortness of breath. Pertinent negatives include no diaphoresis, no fever, no chest pain, no syncope, no abdominal pain, no lower extremity edema and no hemoptysis. Treatments tried: Propranolol. The treatment provided moderate relief.  History of sinus tachycardia, palpitations, GERD, anemia presents with acute onset of palpitations and shortness of breath.  She reports that waking up and she felt her heart racing and could not catch her breath.  No chest pain.  No syncope. She has Had sinus tach before, took a propranolol with some improvement. Recently discharged from the hospital after having a right renal partial nephrectomy through robotic surgery Denies any other complications from the surgery Past Medical History:  Diagnosis Date  . AMA (advanced maternal age) multigravida 54+   . Anemia   . Benign cyst of skin 2014   To neck- removed  . GERD (gastroesophageal reflux disease)   . History of PCOS   . Hypothyroidism   . Palpitations    occasional   . PONV (postoperative nausea and vomiting)    with epidurals had " shakes" afterwards   . Sinus tachycardia   . Yeast infection    hx of in creases of legs per patient, most recent 04/10/2018 patient had to take diflucan x 1 and nystatin cream with clearing.     Patient Active Problem List   Diagnosis Date Noted  . Right renal mass 04/17/2018  . Term pregnancy 06/14/2015  . [redacted] weeks gestation of pregnancy   . Evaluate anatomy not seen on prior sonogram   . Maternal morbid obesity, antepartum ([place])   .  Hypothyroidism affecting pregnancy in second trimester, antepartum   . [redacted] weeks gestation of pregnancy   . Advanced maternal age in multigravida   . AMA (advanced maternal age) multigravida 107+   . Encounter for fetal anatomic survey   . Obesity affecting pregnancy in second trimester, antepartum     Past Surgical History:  Procedure Laterality Date  . CESAREAN SECTION    . COLONOSCOPY    . DILATION AND CURETTAGE OF UTERUS  12/05/2016  . ENDOMETRIAL ABLATION  12/05/2016  . RENAL MASS EXCISION    . ROBOTIC ASSITED PARTIAL NEPHRECTOMY Right 04/17/2018   Procedure: XI ROBOTIC ASSITED PARTIAL NEPHRECTOMY;  Surgeon: Ceasar Mons, MD;  Location: [facility];  Service: Urology;  Laterality: Right;  . TUBAL LIGATION  12/05/2016     OB History    Gravida  3   Para  3   Term  3   Preterm      AB      Living  3     SAB      TAB      Ectopic      Multiple  0   Live Births  3            Home Medications    Prior to Admission medications   Medication Sig Start Date End Date Taking? Authorizing Provider  docusate sodium (COLACE) 100 MG capsule Take 1 capsule (100 mg total) by mouth 2 (two) times  daily as needed for mild constipation. 04/17/18 04/17/19  Ceasar Mons, MD  fluconazole (DIFLUCAN) 100 MG tablet Take 150 mg by mouth daily.  04/10/18   [provider]  fluticasone (FLONASE) 50 MCG/ACT nasal spray Place 1 spray into both nostrils daily as needed for allergies or rhinitis.    [provider]  HYDROcodone-acetaminophen (NORCO) 5-325 MG tablet Take 1 tablet by mouth every 4 (four) hours as needed for moderate pain. 04/17/18   Ceasar Mons, MD  ibuprofen (ADVIL,MOTRIN) 200 MG tablet Take 200-600 mg by mouth every 6 (six) hours as needed for headache or moderate pain.    [provider]  levothyroxine (SYNTHROID, LEVOTHROID) 125 MCG tablet Take 125 mcg by mouth daily before breakfast.    [provider]    nystatin cream (MYCOSTATIN) Apply 1 application topically daily as needed for itching. 08/29/17   [provider]  omeprazole (PRILOSEC) 20 MG capsule Take 20 mg by mouth daily.    [provider]  ondansetron (ZOFRAN) 4 MG tablet Take 1 tablet (4 mg total) by mouth daily as needed for nausea or vomiting. 04/17/18 04/17/19  Ceasar Mons, MD  Prenatal Vit-Fe Fumarate-FA (PRENATAL MULTIVITAMIN) TABS tablet Take 1 tablet by mouth daily.     [provider]  Probiotic Product (PROBIOTIC DAILY PO) Take 1 capsule by mouth daily.    [provider]  propranolol (INDERAL) 10 MG tablet Take 10 mg by mouth 2 (two) times daily as needed (heart palpations- patient states she has not taken in over a year).  05/26/14   [provider]  VITAMIN D, CHOLECALCIFEROL, PO Take 5,000 Units by mouth daily.     [provider]    Family History Family History  Problem Relation Age of Onset  . Hypertension Mother   . Cancer Mother        colon  . CAD Father   . Hypertension Father   . Diabetes Father        Type 2  . Thyroid disease Father   . Kidney disease Father   . Diabetes Daughter        Type 1  . Cancer Paternal Grandmother        liver  . Cancer Paternal Grandfather        leaukemia    Social History Social History   Tobacco Use  . Smoking status: Never Smoker  . Smokeless tobacco: Never Used  Substance Use Topics  . Alcohol use: No  . Drug use: No     Allergies   Aspirin; Ibuprofen; Lexapro [escitalopram]; and Penicillins   Review of Systems Review of Systems  Constitutional: Negative for diaphoresis and fever.  Respiratory: Positive for shortness of breath. Negative for hemoptysis.   Cardiovascular: Positive for palpitations. Negative for chest pain and syncope.  Gastrointestinal: Negative for abdominal pain.  All other systems reviewed and are negative.    Physical Exam Updated Vital Signs BP 112/64   Pulse 82    Temp 98.3 F (36.8 C) (Oral)   Resp 16   Ht 1.689 m (5' 6.5")   Wt (!) 137.9 kg (304 lb)   SpO2 98%   BMI 48.33 kg/m   Physical Exam CONSTITUTIONAL: Well developed/well nourished HEAD: Normocephalic/atraumatic EYES: EOMI/PERRL ENMT: Mucous membranes moist NECK: supple no meningeal signs SPINE/BACK:entire spine nontender CV: S1/S2 noted, no murmurs/rubs/gallops noted LUNGS: Lungs are clear to auscultation bilaterally, no apparent distress ABDOMEN: soft, nontender, no rebound or guarding, bowel sounds noted  throughout abdomen, obese, well-healing incisions noted, no discharge or significant erythema noted GU:no cva tenderness NEURO: Pt is awake/alert/appropriate, moves all extremitiesx4.  No facial droop.   EXTREMITIES: pulses normal/equal, full ROM, no calf tenderness or edema SKIN: warm, color normal PSYCH: no abnormalities of mood noted, alert and oriented to situation   ED Treatments / Results  Labs (all labs ordered are listed, but only abnormal results are displayed) Labs Reviewed  BASIC METABOLIC PANEL - Abnormal; Notable for the following components:      Result Value   Glucose, Bld 124 (*)    Calcium 8.5 (*)    All other components within normal limits  CBC WITH DIFFERENTIAL/PLATELET - Abnormal; Notable for the following components:   Hemoglobin 11.7 (*)    HCT 34.8 (*)    All other components within normal limits  D-DIMER, QUANTITATIVE (NOT AT [facility]) - Abnormal; Notable for the following components:   D-Dimer, Quant 1.11 (*)    All other components within normal limits  TROPONIN I    EKG EKG Interpretation  Date/Time:  Wednesday Apr 22 2018 02:13:55 EDT Ventricular Rate:  113 PR Interval:  152 QRS Duration: 88 QT Interval:  322 QTC Calculation: 441 R Axis:   60 Text Interpretation:  Sinus tachycardia Otherwise normal ECG changed from prior Confirmed by Ripley Fraise [phone]) on 04/22/2018 3:14:45 AM   Radiology Dg Chest 2 View  Result Date:  04/22/2018 CLINICAL DATA:  42 year old female with shortness of breath EXAM: CHEST - 2 VIEW COMPARISON:  Chest radiograph dated 03/19/2018 FINDINGS: The heart size and mediastinal contours are within normal limits. Both lungs are clear. The visualized skeletal structures are unremarkable. IMPRESSION: No active cardiopulmonary disease. Electronically Signed   By: Anner Crete M.D.   On: 04/22/2018 03:44   Ct Angio Chest Pe W And/or Wo Contrast  Result Date: 04/22/2018 CLINICAL DATA:  Tachycardia and recent surgery with elevated D-dimer EXAM: CT ANGIOGRAPHY CHEST WITH CONTRAST TECHNIQUE: Multidetector CT imaging of the chest was performed using the standard protocol during bolus administration of intravenous contrast. Multiplanar CT image reconstructions and MIPs were obtained to evaluate the vascular anatomy. CONTRAST:  168mL [id number] COMPARISON:  Plain film from earlier in the same day. FINDINGS: Cardiovascular: Thoracic aorta shows no significant aneurysmal dilatation or dissection. No atherosclerotic changes are noted. Heart is at the upper limits of normal in size. The pulmonary artery shows a normal branching pattern without definitive filling defect to suggest pulmonary embolism. Mediastinum/Nodes: The esophagus is within normal limits. No hilar or mediastinal adenopathy is noted. The thoracic inlet demonstrates a nodule extending from the inferior aspect of the right lobe of thyroid into the superior mediastinum. It measures approximately 2.4 cm in greatest dimension. Lungs/Pleura: Azygos lobe is noted. No focal infiltrate is seen. Minimal bibasilar atelectasis is noted. No sizable parenchymal nodules are seen. Upper Abdomen: Within normal limits. Musculoskeletal: Mild degenerative changes of the thoracic spine are noted. No acute bony abnormality is seen. Review of the MIP images confirms the above findings. IMPRESSION: No evidence of pulmonary emboli. Minimal bibasilar atelectasis. 2.4 cm nodule  arising from the lower pole of the right lobe of the thyroid extending into the mediastinum. Nonemergent ultrasound may be helpful for further evaluation. Electronically Signed   By: Inez Catalina M.D.   On: 04/22/2018 07:06    Procedures Procedures   Medications Ordered in ED Medications  acetaminophen (TYLENOL) tablet 1,000 mg (1,000 mg Oral Given 04/22/18 0358)     Initial Impression / Assessment  and Plan / ED Course  I have reviewed the triage vital signs and the nursing notes.  Pertinent labs & imaging results that were available during my care of the patient were reviewed by me and considered in my medical decision making (see chart for details).     5:30 AM Patient with recent renal surgery presenting with palpitations shortness of breath.  Due to history of surgery, d-dimer was ordered which was elevated. We will proceed with CT chest 7:14 AM CT chest is negative.  Patient feels improved.  No tachycardia or hypoxia noted.  She was informed of thyroid nodule, will follow with PCP  Final Clinical Impressions(s) / ED Diagnoses   Final diagnoses:  Palpitations    ED Discharge Orders    None       Ripley Fraise, MD 04/22/18 [phone]

## 2018-04-22 NOTE — ED Triage Notes (Signed)
Pt reports episodes of awaking with rapid heart beat. Pt states hx of ST and took propanolol 10 mg PTA.

## 2018-04-22 NOTE — ED Notes (Signed)
NAD at this time. Pt is stable and going home.  

## 2018-04-24 DIAGNOSIS — C641 Malignant neoplasm of right kidney, except renal pelvis: Secondary | ICD-10-CM | POA: Diagnosis not present

## 2018-04-29 ENCOUNTER — Emergency Department (HOSPITAL_COMMUNITY): Payer: 59

## 2018-04-29 ENCOUNTER — Emergency Department (HOSPITAL_COMMUNITY)
Admission: EM | Admit: 2018-04-29 | Discharge: 2018-04-29 | Disposition: A | Discharge status: Home / Self Care | Payer: 59 | Attending: Emergency Medicine | Admitting: Emergency Medicine

## 2018-04-29 ENCOUNTER — Encounter (HOSPITAL_COMMUNITY): Payer: Self-pay

## 2018-04-29 ENCOUNTER — Other Ambulatory Visit: Payer: Self-pay

## 2018-04-29 ENCOUNTER — Telehealth: Payer: Self-pay | Admitting: Genetic Counselor

## 2018-04-29 ENCOUNTER — Encounter: Payer: Self-pay | Admitting: Genetic Counselor

## 2018-04-29 DIAGNOSIS — E039 Hypothyroidism, unspecified: Secondary | ICD-10-CM | POA: Insufficient documentation

## 2018-04-29 DIAGNOSIS — R301 Vesical tenesmus: Secondary | ICD-10-CM | POA: Diagnosis not present

## 2018-04-29 DIAGNOSIS — Z79899 Other long term (current) drug therapy: Secondary | ICD-10-CM | POA: Diagnosis not present

## 2018-04-29 DIAGNOSIS — R310 Gross hematuria: Secondary | ICD-10-CM | POA: Insufficient documentation

## 2018-04-29 DIAGNOSIS — C649 Malignant neoplasm of unspecified kidney, except renal pelvis: Secondary | ICD-10-CM | POA: Insufficient documentation

## 2018-04-29 DIAGNOSIS — R1030 Lower abdominal pain, unspecified: Secondary | ICD-10-CM | POA: Diagnosis not present

## 2018-04-29 DIAGNOSIS — N3289 Other specified disorders of bladder: Secondary | ICD-10-CM | POA: Diagnosis not present

## 2018-04-29 DIAGNOSIS — R109 Unspecified abdominal pain: Secondary | ICD-10-CM

## 2018-04-29 DIAGNOSIS — R319 Hematuria, unspecified: Secondary | ICD-10-CM | POA: Diagnosis not present

## 2018-04-29 LAB — CBC
HEMATOCRIT: 40.2 % (ref 36.0–46.0)
Hemoglobin: 12.8 g/dL (ref 12.0–15.0)
MCH: 26.4 pg (ref 26.0–34.0)
MCHC: 31.8 g/dL (ref 30.0–36.0)
MCV: 83.1 fL (ref 78.0–100.0)
PLATELETS: 457 10*3/uL — AB (ref 150–400)
RBC: 4.84 MIL/uL (ref 3.87–5.11)
RDW: 12.7 % (ref 11.5–15.5)
WBC: 9.7 10*3/uL (ref 4.0–10.5)

## 2018-04-29 LAB — URINALYSIS, ROUTINE W REFLEX MICROSCOPIC

## 2018-04-29 LAB — ABO/RH: ABO/RH(D): A POS

## 2018-04-29 LAB — BASIC METABOLIC PANEL
Anion gap: 12 (ref 5–15)
BUN: 13 mg/dL (ref 6–20)
CALCIUM: 9.1 mg/dL (ref 8.9–10.3)
CO2: 20 mmol/L — ABNORMAL LOW (ref 22–32)
CREATININE: 0.93 mg/dL (ref 0.44–1.00)
Chloride: 108 mmol/L (ref 101–111)
GFR calc Af Amer: >60 mL/min (ref >60)
GLUCOSE: 156 mg/dL — AB (ref 65–99)
Potassium: 4 mmol/L (ref 3.5–5.1)
Sodium: 140 mmol/L (ref 135–145)

## 2018-04-29 LAB — URINALYSIS, MICROSCOPIC (REFLEX)
BACTERIA UA: NONE SEEN
RBC / HPF: >50 RBC/hpf (ref 0–5)
SQUAMOUS EPITHELIAL / LPF: NONE SEEN (ref 0–5)
WBC UA: NONE SEEN WBC/hpf (ref 0–5)

## 2018-04-29 LAB — TYPE AND SCREEN
ABO/RH(D): A POS
ANTIBODY SCREEN: NEGATIVE

## 2018-04-29 LAB — I-STAT BETA HCG BLOOD, ED (MC, WL, AP ONLY): I-stat hCG, quantitative: <5 m[IU]/mL (ref <5)

## 2018-04-29 MED ORDER — SODIUM CHLORIDE 0.9 % IV SOLN
1.0000 g | Freq: Once | INTRAVENOUS | Status: AC
  Administered 2018-04-29: 1 g via INTRAVENOUS
  Filled 2018-04-29: qty 10

## 2018-04-29 MED ORDER — PHENAZOPYRIDINE HCL 100 MG PO TABS
200.0000 mg | ORAL_TABLET | Freq: Once | ORAL | Status: AC
  Administered 2018-04-29: 200 mg via ORAL
  Filled 2018-04-29: qty 2

## 2018-04-29 MED ORDER — IOHEXOL 300 MG/ML  SOLN
100.0000 mL | Freq: Once | INTRAMUSCULAR | Status: AC | PRN
  Administered 2018-04-29: 100 mL via INTRAVENOUS

## 2018-04-29 MED ORDER — ONDANSETRON 8 MG PO TBDP: 8.0000 mg | ORAL_TABLET | Freq: Three times a day (TID) | ORAL | 0 refills | Status: DC | PRN

## 2018-04-29 MED ORDER — HYDROMORPHONE HCL 2 MG/ML IJ SOLN
1.0000 mg | Freq: Once | INTRAMUSCULAR | Status: AC
  Administered 2018-04-29: 1 mg via INTRAVENOUS
  Filled 2018-04-29: qty 1

## 2018-04-29 MED ORDER — OXYBUTYNIN CHLORIDE 5 MG PO TABS: 5.0000 mg | ORAL_TABLET | Freq: Three times a day (TID) | ORAL | Status: DC

## 2018-04-29 MED ORDER — OXYBUTYNIN CHLORIDE 5 MG PO TABS: 5.0000 mg | ORAL_TABLET | Freq: Two times a day (BID) | ORAL | 0 refills | Status: AC

## 2018-04-29 MED ORDER — OXYBUTYNIN CHLORIDE 5 MG PO TABS
5.0000 mg | ORAL_TABLET | Freq: Once | ORAL | Status: AC
  Administered 2018-04-29: 5 mg via ORAL
  Filled 2018-04-29: qty 1

## 2018-04-29 MED ORDER — HYDROCODONE-ACETAMINOPHEN 5-325 MG PO TABS: 1.0000 | ORAL_TABLET | ORAL | 0 refills | Status: DC | PRN

## 2018-04-29 MED ORDER — MORPHINE SULFATE (PF) 4 MG/ML IV SOLN
4.0000 mg | Freq: Once | INTRAVENOUS | Status: AC
  Administered 2018-04-29: 4 mg via INTRAVENOUS
  Filled 2018-04-29: qty 1

## 2018-04-29 MED ORDER — DIAZEPAM 5 MG PO TABS
5.0000 mg | ORAL_TABLET | Freq: Once | ORAL | Status: AC
  Administered 2018-04-29: 5 mg via ORAL
  Filled 2018-04-29: qty 1

## 2018-04-29 MED ORDER — LORAZEPAM 1 MG PO TABS
1.0000 mg | ORAL_TABLET | Freq: Once | ORAL | Status: AC
  Administered 2018-04-29: 1 mg via ORAL
  Filled 2018-04-29: qty 1

## 2018-04-29 MED FILL — SULFAMETHOXAZOLE-TMP DS TAB: 800-160 | 7 days supply | Qty: 14 | Fill #0

## 2018-04-29 NOTE — ED Triage Notes (Signed)
Pt states she had partial nephrectomy on 04/17/18. Pt states she went to her nephrologist this morning because she had an episode of hematuria. Pt states she did not have hematuria at the MD office but most recently her urine was only blood.

## 2018-04-29 NOTE — ED Notes (Signed)
Walked patient to the bathroom patient is now back in bed call bell in reach and family at bedside

## 2018-04-29 NOTE — Telephone Encounter (Signed)
Genetic counseling referral from Dr. Lovena Neighbours at [facility]. Spoke to the pt's husband and scheduled her to see Ferol Luz for genetic counseling on 6/24 at 10am. Letter mailed.

## 2018-04-29 NOTE — ED Notes (Signed)
Pt taken to the restroom, moderate amount of blood noted in the toilet. Will place pt in trauma A to initiate treatment.

## 2018-04-29 NOTE — ED Notes (Signed)
Patient transported to CT 

## 2018-04-29 NOTE — ED Provider Notes (Signed)
[facility] EMERGENCY DEPARTMENT Provider Note   CSN: [id number] Arrival date & time: 04/29/18  0945     History   Chief Complaint Chief Complaint  Patient presents with  . Hematuria    HPI Kathleen Henry is a 41 y.o. female.  HPI Patient is a 42 year old female who is 2 weeks out from a right partial nephrectomy for renal cell carcinoma.  She presents to the emergency department today with new frank hematuria and passage of a blood clot from her urine.  She was seen in the urology office earlier this morning and prescribed an antibiotic which she has not had filled yet as she developed more gross hematuria while at the pharmacy that she came immediately to the [facility] emergency department for further evaluation.  She describes severe lower abdominal pain at this time.  No fevers or chills.  No flank pain.  No upper abdominal pain.  Reports nausea without vomiting.  Denies diarrhea.  No urinary retention but does report ongoing urinary urgency at this time.  Pain is severe in severity at this time     Past Medical History:  Diagnosis Date  . AMA (advanced maternal age) multigravida 90+   . Anemia   . Benign cyst of skin 2014   To neck- removed  . GERD (gastroesophageal reflux disease)   . History of PCOS   . Hypothyroidism   . Palpitations    occasional   . PONV (postoperative nausea and vomiting)    with epidurals had " shakes" afterwards   . Sinus tachycardia   . Yeast infection    hx of in creases of legs per patient, most recent 04/10/2018 patient had to take diflucan x 1 and nystatin cream with clearing.     Patient Active Problem List   Diagnosis Date Noted  . Right renal mass 04/17/2018  . Term pregnancy 06/14/2015  . [redacted] weeks gestation of pregnancy   . Evaluate anatomy not seen on prior sonogram   . Maternal morbid obesity, antepartum ([place])   . Hypothyroidism affecting pregnancy in second trimester, antepartum   . [redacted] weeks gestation of  pregnancy   . Advanced maternal age in multigravida   . AMA (advanced maternal age) multigravida 68+   . Encounter for fetal anatomic survey   . Obesity affecting pregnancy in second trimester, antepartum     Past Surgical History:  Procedure Laterality Date  . CESAREAN SECTION    . COLONOSCOPY    . DILATION AND CURETTAGE OF UTERUS  12/05/2016  . ENDOMETRIAL ABLATION  12/05/2016  . RENAL MASS EXCISION    . ROBOTIC ASSITED PARTIAL NEPHRECTOMY Right 04/17/2018   Procedure: XI ROBOTIC ASSITED PARTIAL NEPHRECTOMY;  Surgeon: Ceasar Mons, MD;  Location: [facility];  Service: Urology;  Laterality: Right;  . TUBAL LIGATION  12/05/2016     OB History    Gravida  3   Para  3   Term  3   Preterm      AB      Living  3     SAB      TAB      Ectopic      Multiple  0   Live Births  3            Home Medications    Prior to Admission medications   Medication Sig Start Date End Date Taking? Authorizing Provider  acetaminophen (TYLENOL) 500 MG tablet Take 1,000 mg by mouth every  6 (six) hours as needed for moderate pain.   Yes [provider]  fluticasone (FLONASE) 50 MCG/ACT nasal spray Place 1 spray into both nostrils daily.    Yes [provider]  levothyroxine (SYNTHROID, LEVOTHROID) 125 MCG tablet Take 125 mcg by mouth daily before breakfast.   Yes [provider]  omeprazole (PRILOSEC) 20 MG capsule Take 20 mg by mouth daily.   Yes [provider]  Prenatal Vit-Fe Fumarate-FA (PRENATAL MULTIVITAMIN) TABS tablet Take 1 tablet by mouth daily.    Yes [provider]  VITAMIN D, CHOLECALCIFEROL, PO Take 5,000 Units by mouth daily.    Yes [provider]  docusate sodium (COLACE) 100 MG capsule Take 1 capsule (100 mg total) by mouth 2 (two) times daily as needed for mild constipation. 04/17/18 04/17/19  Ceasar Mons, MD  fluconazole (DIFLUCAN) 100 MG tablet Take 150 mg by mouth daily.  04/10/18    [provider]  HYDROcodone-acetaminophen (NORCO) 5-325 MG tablet Take 1 tablet by mouth every 4 (four) hours as needed for moderate pain. 04/17/18   Ceasar Mons, MD  HYDROcodone-acetaminophen (NORCO/VICODIN) 5-325 MG tablet Take 1 tablet by mouth every 4 (four) hours as needed for moderate pain. 04/29/18   Jola Schmidt, MD  ibuprofen (ADVIL,MOTRIN) 200 MG tablet Take 200-600 mg by mouth every 6 (six) hours as needed for headache or moderate pain.    [provider]  nystatin cream (MYCOSTATIN) Apply 1 application topically daily as needed for itching. 08/29/17   [provider]  ondansetron (ZOFRAN ODT) 8 MG disintegrating tablet Take 1 tablet (8 mg total) by mouth every 8 (eight) hours as needed for nausea or vomiting. 04/29/18   Jola Schmidt, MD  ondansetron (ZOFRAN) 4 MG tablet Take 1 tablet (4 mg total) by mouth daily as needed for nausea or vomiting. 04/17/18 04/17/19  Ceasar Mons, MD  oxybutynin (DITROPAN) 5 MG tablet Take 1 tablet (5 mg total) by mouth 2 (two) times daily. 04/29/18   Jola Schmidt, MD  Probiotic Product (PROBIOTIC DAILY PO) Take 1 capsule by mouth daily.    [provider]  propranolol (INDERAL) 10 MG tablet Take 10 mg by mouth 2 (two) times daily as needed (heart palpations- patient states she has not taken in over a year).  05/26/14   [provider]    Family History Family History  Problem Relation Age of Onset  . Hypertension Mother   . Cancer Mother        colon  . CAD Father   . Hypertension Father   . Diabetes Father        Type 2  . Thyroid disease Father   . Kidney disease Father   . Diabetes Daughter        Type 1  . Cancer Paternal Grandmother        liver  . Cancer Paternal Grandfather        leaukemia    Social History Social History   Tobacco Use  . Smoking status: Never Smoker  . Smokeless tobacco: Never Used  Substance Use Topics  . Alcohol use: No  . Drug use: No      Allergies   Aspirin; Ibuprofen; Lexapro [escitalopram]; and Penicillins   Review of Systems Review of Systems  All other systems reviewed and are negative.    Physical Exam Updated Vital Signs BP 125/87   Pulse 73   Temp 98.8 F (37.1 C) (Oral)   Resp (!) 24  SpO2 95%   Physical Exam  Constitutional: She is oriented to person, place, and time. She appears well-developed and well-nourished. She appears distressed.  Uncomfortable appearing  HENT:  Head: Normocephalic and atraumatic.  Eyes: EOM are normal.  Neck: Normal range of motion.  Cardiovascular: Normal rate, regular rhythm and normal heart sounds.  Pulmonary/Chest: Effort normal and breath sounds normal.  Abdominal: Soft. She exhibits no distension. There is no tenderness.  Musculoskeletal: Normal range of motion.  Neurological: She is alert and oriented to person, place, and time.  Skin: Skin is warm and dry.  Psychiatric: She has a normal mood and affect. Judgment normal.  Nursing note and vitals reviewed.    ED Treatments / Results  Labs (all labs ordered are listed, but only abnormal results are displayed) Labs Reviewed  CBC - Abnormal; Notable for the following components:      Result Value   Platelets 457 (*)    All other components within normal limits  BASIC METABOLIC PANEL - Abnormal; Notable for the following components:   CO2 20 (*)    Glucose, Bld 156 (*)    All other components within normal limits  URINALYSIS, ROUTINE W REFLEX MICROSCOPIC - Abnormal; Notable for the following components:   Color, Urine GROSSLY BLOODY (*)    APPearance TURBID (*)    Glucose, UA   (*)    Value: TEST NOT REPORTED DUE TO COLOR INTERFERENCE OF URINE PIGMENT   Hgb urine dipstick   (*)    Value: TEST NOT REPORTED DUE TO COLOR INTERFERENCE OF URINE PIGMENT   Bilirubin Urine   (*)    Value: TEST NOT REPORTED DUE TO COLOR INTERFERENCE OF URINE PIGMENT   Ketones, ur   (*)    Value: TEST NOT REPORTED DUE TO  COLOR INTERFERENCE OF URINE PIGMENT   Protein, ur   (*)    Value: TEST NOT REPORTED DUE TO COLOR INTERFERENCE OF URINE PIGMENT   Nitrite   (*)    Value: TEST NOT REPORTED DUE TO COLOR INTERFERENCE OF URINE PIGMENT   Leukocytes, UA   (*)    Value: TEST NOT REPORTED DUE TO COLOR INTERFERENCE OF URINE PIGMENT   All other components within normal limits  URINE CULTURE  URINALYSIS, MICROSCOPIC (REFLEX)  I-STAT BETA HCG BLOOD, ED ([facility], [facility], [facility] ONLY)  TYPE AND SCREEN  ABO/RH    EKG None  Radiology Ct Abdomen Pelvis W Contrast  Result Date: 04/29/2018 CLINICAL DATA:  Hematuria.  Recent partial nephrectomy on the right. EXAM: CT ABDOMEN AND PELVIS WITH CONTRAST TECHNIQUE: Multidetector CT imaging of the abdomen and pelvis was performed using the standard protocol following bolus administration of intravenous contrast. CONTRAST:  141mL OMNIPAQUE IOHEXOL 300 MG/ML  SOLN COMPARISON:  February 10, 2018 FINDINGS: Lower chest: There is slight atelectatic change in the posterior left base and in the right middle lobe. No consolidation noted in the lung bases. Hepatobiliary: No focal liver lesions are evident. Gallbladder is borderline distended without wall thickening. There is no biliary duct dilatation. Pancreas: No pancreatic mass or inflammatory focus. Spleen: No splenic lesions evident. Adrenals/Urinary Tract: Adrenals bilaterally appear normal. Left kidney appears normal. There is postoperative change on the right with ill-defined decreased attenuation material throughout much of the periphery of the right kidney consistent with recent hemorrhage/hematoma. There is moderate perinephric stranding in this area. The area of apparent hematoma measures 5.5 x 5.0 x 4.2 cm. There is no apparent contrast extravasation demonstrated on this study. There is  no appreciable hydronephrosis. Note, however, that there is delayed contrast excretion into the collecting system on the right compared to the left. No renal or  ureteral calculus is evident on either side. A Foley catheter extends into the urinary bladder. There is increased attenuation fluid throughout the urinary bladder as well as a small amount of air in the bladder. There is no urinary bladder wall thickening. Stomach/Bowel: There is no appreciable bowel wall or mesenteric thickening. No evident bowel obstruction. No free air or portal venous air. Vascular/Lymphatic: No abdominal aortic aneurysm. No vascular lesions are evident. No adenopathy is appreciable in the abdomen or pelvis. Reproductive: The uterus is anteverted. No evident pelvic mass. Tubal ligation clips present bilaterally. Other: Appendix appears normal. No abscess or ascites is evident in the abdomen or pelvis. There is hematoma in the right anterolateral abdominal wall with areas of air within the anterior and lateral right abdominal wall consistent with postoperative change given the clinical history. Musculoskeletal: No blastic or lytic bone lesions are evident. No intramuscular lesions are appreciable. IMPRESSION: 1. Moderate presumed postoperative hematoma involving the right kidney. There is compression of the remaining renal parenchyma on the right, sufficient to lead to delayed excretion of contrast into the collecting system on the right. There is moderate hematoma and stranding in the perinephric fascia, particularly along the lateral aspect of the right kidney. Note that there is renal function on the right with contrast enhancement in portions of the renal parenchyma on the right. No obstructing calculus is seen. No acute appearing contrast extravasation suggesting active hemorrhage is demonstrated on this examination. No renal or ureteral calculus evident on either side. No hydronephrosis. Note that compression of this nature makes this patient at increased risk for development of Page kidney. Close clinical assessment in this regard will be necessary. 2. Presumed hemorrhage within the  urinary bladder. Small amount of air is present in the bladder, likely due to catheterization. Note that there is a Foley catheter in the bladder which is incomplete decompressing the bladder. 3. Postoperative changes including hematoma and air in the right abdominal wall. No abscess seen in the abdominal wall. 4.  Gallbladder borderline distended without wall thickening. 5. No evident bowel obstruction. No abscess. Appendix appears normal. 6. Tubal ligation clips noted in the respective fallopian tube regions bilaterally. Electronically Signed   By: Lowella Grip III M.D.   On: 04/29/2018 12:19    Procedures Procedures (including critical care time)  Medications Ordered in ED Medications  LORazepam (ATIVAN) tablet 1 mg (1 mg Oral Given 04/29/18 1141)  phenazopyridine (PYRIDIUM) tablet 200 mg (200 mg Oral Given 04/29/18 1141)  morphine 4 MG/ML injection 4 mg (4 mg Intravenous Given 04/29/18 1144)  iohexol (OMNIPAQUE) 300 MG/ML solution 100 mL (100 mLs Intravenous Contrast Given 04/29/18 1152)  HYDROmorphone (DILAUDID) injection 1 mg (1 mg Intravenous Given 04/29/18 1255)  oxybutynin (DITROPAN) tablet 5 mg (5 mg Oral Given 04/29/18 1400)  diazepam (VALIUM) tablet 5 mg (5 mg Oral Given 04/29/18 1254)  cefTRIAXone (ROCEPHIN) 1 g in sodium chloride 0.9 % 100 mL IVPB (0 g Intravenous Stopped 04/29/18 1400)     Initial Impression / Assessment and Plan / ED Course  I have reviewed the triage vital signs and the nursing notes.  Pertinent labs & imaging results that were available during my care of the patient were reviewed by me and considered in my medical decision making (see chart for details).     93 French Foley catheter placed with hand  irrigation resulting in removal of several small clots.  No large volume clots removed.  Hematuria is thin and Kool-Aid in consistency.  No frank abdominal tenderness.  Healing laparoscopic incisions in the right side of her abdomen.   2:21 PM Patient's pain is  improved.  Case was discussed with urology, Dr. Wendy Poet, who will arrange for follow-up in the office tomorrow morning.  Request home with oxybutynin and home with Foley catheter in place.  CT scan shows moderate hematoma of the right kidney without active extravasation..  No other postoperative complications noted on CT imaging.  Pain improved.  Discharged home in good condition.  Patient understands return to the [facility] emergency department for any new or worsening symptoms.  Urology follow-up tomorrow afternoon  Rocephin given in the emergency department.  Urine culture sent.  Patient will continue her home antibiotics per the recommendations of her urologist with next dose due tomorrow morning.  All questions answered  Final Clinical Impressions(s) / ED Diagnoses   Final diagnoses:  Acute abdominal pain  Gross hematuria  Painful bladder spasm    ED Discharge Orders        Ordered    oxybutynin (DITROPAN) 5 MG tablet  2 times daily     04/29/18 1416    HYDROcodone-acetaminophen (NORCO/VICODIN) 5-325 MG tablet  Every 4 hours PRN     04/29/18 1416    ondansetron (ZOFRAN ODT) 8 MG disintegrating tablet  Every 8 hours PRN     04/29/18 1416       Jola Schmidt, MD 04/29/18 1423

## 2018-04-29 NOTE — ED Notes (Signed)
Post cath insertion care given.

## 2018-04-30 ENCOUNTER — Encounter (HOSPITAL_COMMUNITY): Payer: Self-pay | Admitting: *Deleted

## 2018-04-30 ENCOUNTER — Ambulatory Visit (HOSPITAL_COMMUNITY): Payer: 59 | Admitting: Certified Registered Nurse Anesthetist

## 2018-04-30 ENCOUNTER — Encounter (HOSPITAL_COMMUNITY)
Admission: RE | Disposition: A | Discharge status: Home / Self Care | Payer: Self-pay | Source: Other Acute Inpatient Hospital | Attending: Urology

## 2018-04-30 ENCOUNTER — Ambulatory Visit (HOSPITAL_COMMUNITY)
Admission: RE | Admit: 2018-04-30 | Discharge: 2018-04-30 | Disposition: A | Discharge status: Home / Self Care | Payer: 59 | Source: Other Acute Inpatient Hospital | Attending: Urology | Admitting: Urology

## 2018-04-30 ENCOUNTER — Other Ambulatory Visit: Payer: Self-pay

## 2018-04-30 ENCOUNTER — Other Ambulatory Visit: Payer: Self-pay | Admitting: Urology

## 2018-04-30 DIAGNOSIS — E039 Hypothyroidism, unspecified: Secondary | ICD-10-CM | POA: Insufficient documentation

## 2018-04-30 DIAGNOSIS — Z79890 Hormone replacement therapy (postmenopausal): Secondary | ICD-10-CM | POA: Diagnosis not present

## 2018-04-30 DIAGNOSIS — R310 Gross hematuria: Secondary | ICD-10-CM | POA: Insufficient documentation

## 2018-04-30 DIAGNOSIS — C641 Malignant neoplasm of right kidney, except renal pelvis: Secondary | ICD-10-CM | POA: Diagnosis not present

## 2018-04-30 DIAGNOSIS — N2889 Other specified disorders of kidney and ureter: Secondary | ICD-10-CM | POA: Diagnosis not present

## 2018-04-30 DIAGNOSIS — Z7951 Long term (current) use of inhaled steroids: Secondary | ICD-10-CM | POA: Insufficient documentation

## 2018-04-30 DIAGNOSIS — Z905 Acquired absence of kidney: Secondary | ICD-10-CM | POA: Diagnosis not present

## 2018-04-30 DIAGNOSIS — K219 Gastro-esophageal reflux disease without esophagitis: Secondary | ICD-10-CM | POA: Diagnosis not present

## 2018-04-30 DIAGNOSIS — Z79899 Other long term (current) drug therapy: Secondary | ICD-10-CM | POA: Insufficient documentation

## 2018-04-30 DIAGNOSIS — N3289 Other specified disorders of bladder: Secondary | ICD-10-CM | POA: Insufficient documentation

## 2018-04-30 HISTORY — PX: CYSTOSCOPY WITH FULGERATION: SHX6638

## 2018-04-30 LAB — URINE CULTURE: Culture: NO GROWTH

## 2018-04-30 SURGERY — CYSTOSCOPY, WITH BLADDER FULGURATION
Anesthesia: General

## 2018-04-30 MED ORDER — ONDANSETRON HCL 4 MG/2ML IJ SOLN
INTRAMUSCULAR | Status: DC | PRN
Start: 2018-04-30 — End: 2018-04-30
  Administered 2018-04-30: 4 mg via INTRAVENOUS

## 2018-04-30 MED ORDER — 0.9 % SODIUM CHLORIDE (POUR BTL) OPTIME
TOPICAL | Status: DC | PRN
  Administered 2018-04-30: 1000 mL

## 2018-04-30 MED ORDER — FENTANYL CITRATE (PF) 100 MCG/2ML IJ SOLN
INTRAMUSCULAR | Status: AC
  Filled 2018-04-30: qty 2

## 2018-04-30 MED ORDER — SUGAMMADEX SODIUM 200 MG/2ML IV SOLN
INTRAVENOUS | Status: DC | PRN
  Administered 2018-04-30: 500 mg via INTRAVENOUS

## 2018-04-30 MED ORDER — MIDAZOLAM HCL 2 MG/2ML IJ SOLN
INTRAMUSCULAR | Status: AC
  Filled 2018-04-30: qty 2

## 2018-04-30 MED ORDER — FENTANYL CITRATE (PF) 100 MCG/2ML IJ SOLN
25.0000 ug | INTRAMUSCULAR | Status: DC | PRN
  Administered 2018-04-30 (×2): 50 ug via INTRAVENOUS

## 2018-04-30 MED ORDER — SODIUM CHLORIDE 0.9 % IR SOLN
Status: DC | PRN
  Administered 2018-04-30: 3000 mL

## 2018-04-30 MED ORDER — ROCURONIUM BROMIDE 10 MG/ML (PF) SYRINGE
PREFILLED_SYRINGE | INTRAVENOUS | Status: DC | PRN
Start: 2018-04-30 — End: 2018-04-30
  Administered 2018-04-30: 20 mg via INTRAVENOUS

## 2018-04-30 MED ORDER — SUGAMMADEX SODIUM 500 MG/5ML IV SOLN
INTRAVENOUS | Status: AC
  Filled 2018-04-30: qty 5

## 2018-04-30 MED ORDER — OXYCODONE HCL 5 MG/5ML PO SOLN
5.0000 mg | Freq: Once | ORAL | Status: AC | PRN
  Filled 2018-04-30: qty 5

## 2018-04-30 MED ORDER — PROPOFOL 10 MG/ML IV BOLUS
INTRAVENOUS | Status: AC
  Filled 2018-04-30: qty 20

## 2018-04-30 MED ORDER — PROPOFOL 10 MG/ML IV BOLUS
INTRAVENOUS | Status: DC | PRN
  Administered 2018-04-30: 200 mg via INTRAVENOUS

## 2018-04-30 MED ORDER — OXYCODONE HCL 5 MG PO TABS
ORAL_TABLET | ORAL | Status: AC
  Filled 2018-04-30: qty 1

## 2018-04-30 MED ORDER — PHENYLEPHRINE 40 MCG/ML (10ML) SYRINGE FOR IV PUSH (FOR BLOOD PRESSURE SUPPORT)
PREFILLED_SYRINGE | INTRAVENOUS | Status: AC
  Filled 2018-04-30: qty 10

## 2018-04-30 MED ORDER — ONDANSETRON HCL 4 MG/2ML IJ SOLN: 4.0000 mg | Freq: Once | INTRAMUSCULAR | Status: DC | PRN

## 2018-04-30 MED ORDER — LACTATED RINGERS IV SOLN
INTRAVENOUS | Status: DC
  Administered 2018-04-30: 15:00:00 via INTRAVENOUS

## 2018-04-30 MED ORDER — MIRABEGRON ER 50 MG PO TB24: 50.0000 mg | ORAL_TABLET | Freq: Every day | ORAL | 0 refills | Status: AC

## 2018-04-30 MED ORDER — OXYCODONE HCL 5 MG PO TABS
5.0000 mg | ORAL_TABLET | Freq: Once | ORAL | Status: AC | PRN
  Administered 2018-04-30: 5 mg via ORAL

## 2018-04-30 MED ORDER — PHENYLEPHRINE 40 MCG/ML (10ML) SYRINGE FOR IV PUSH (FOR BLOOD PRESSURE SUPPORT)
PREFILLED_SYRINGE | INTRAVENOUS | Status: DC | PRN
  Administered 2018-04-30: 80 ug via INTRAVENOUS

## 2018-04-30 MED ORDER — FENTANYL CITRATE (PF) 250 MCG/5ML IJ SOLN
INTRAMUSCULAR | Status: DC | PRN
  Administered 2018-04-30: 100 ug via INTRAVENOUS

## 2018-04-30 MED ORDER — DEXAMETHASONE SODIUM PHOSPHATE 10 MG/ML IJ SOLN
INTRAMUSCULAR | Status: DC | PRN
  Administered 2018-04-30: 10 mg via INTRAVENOUS

## 2018-04-30 MED ORDER — SUCCINYLCHOLINE CHLORIDE 200 MG/10ML IV SOSY
PREFILLED_SYRINGE | INTRAVENOUS | Status: DC | PRN
Start: 2018-04-30 — End: 2018-04-30
  Administered 2018-04-30: 180 mg via INTRAVENOUS

## 2018-04-30 MED ORDER — MIDAZOLAM HCL 5 MG/5ML IJ SOLN
INTRAMUSCULAR | Status: DC | PRN
  Administered 2018-04-30: 2 mg via INTRAVENOUS

## 2018-04-30 MED ORDER — SCOPOLAMINE 1 MG/3DAYS TD PT72
1.0000 | MEDICATED_PATCH | TRANSDERMAL | Status: DC
  Administered 2018-04-30: 1.5 mg via TRANSDERMAL

## 2018-04-30 MED ORDER — MIRABEGRON ER 25 MG PO TB24
50.0000 mg | ORAL_TABLET | Freq: Once | ORAL | Status: AC
  Administered 2018-04-30: 50 mg via ORAL
  Filled 2018-04-30: qty 2

## 2018-04-30 MED ORDER — CLINDAMYCIN PHOSPHATE 900 MG/50ML IV SOLN
900.0000 mg | Freq: Once | INTRAVENOUS | Status: AC
  Administered 2018-04-30: 900 mg via INTRAVENOUS
  Filled 2018-04-30: qty 50

## 2018-04-30 MED ORDER — ROCURONIUM BROMIDE 10 MG/ML (PF) SYRINGE
PREFILLED_SYRINGE | INTRAVENOUS | Status: AC
  Filled 2018-04-30: qty 5

## 2018-04-30 MED ORDER — SCOPOLAMINE 1 MG/3DAYS TD PT72
MEDICATED_PATCH | TRANSDERMAL | Status: AC
  Filled 2018-04-30: qty 1

## 2018-04-30 SURGICAL SUPPLY — 19 items
BAG URINE DRAINAGE (UROLOGICAL SUPPLIES) IMPLANT
BAG URO CATCHER STRL LF (MISCELLANEOUS) ×2 IMPLANT
CATH AINSWORTH 30CC 24FR (CATHETERS) ×1 IMPLANT
CATH HEMA 3WAY 30CC 24FR COUDE (CATHETERS) IMPLANT
COVER FOOTSWITCH UNIV (MISCELLANEOUS) IMPLANT
COVER SURGICAL LIGHT HANDLE (MISCELLANEOUS) ×2 IMPLANT
ELECT REM PT RETURN 15FT ADLT (MISCELLANEOUS) ×1 IMPLANT
EVACUATOR MICROVAS BLADDER (UROLOGICAL SUPPLIES) ×1 IMPLANT
GLOVE BIOGEL M 8.0 STRL (GLOVE) ×2 IMPLANT
GOWN STRL REUS W/TWL XL LVL3 (GOWN DISPOSABLE) ×2 IMPLANT
HOLDER FOLEY CATH W/STRAP (MISCELLANEOUS) ×1 IMPLANT
LOOP CUT BIPOLAR 24F LRG (ELECTROSURGICAL) IMPLANT
MANIFOLD NEPTUNE II (INSTRUMENTS) ×2 IMPLANT
NS IRRIG 1000ML POUR BTL (IV SOLUTION) ×2 IMPLANT
PACK CYSTO (CUSTOM PROCEDURE TRAY) ×2 IMPLANT
SET ASPIRATION TUBING (TUBING) ×1 IMPLANT
SYRINGE IRR TOOMEY STRL 70CC (SYRINGE) ×1 IMPLANT
TUBING CONNECTING 10 (TUBING) ×2 IMPLANT
TUBING UROLOGY SET (TUBING) ×2 IMPLANT

## 2018-04-30 NOTE — Transfer of Care (Signed)
Immediate Anesthesia Transfer of Care Note  Patient: Kathleen Henry  Procedure(s) Performed: CYSTOSCOPY WITH EVACUATION (N/A )  Patient Location: PACU  Anesthesia Type:General  Level of Consciousness: awake, alert , oriented and patient cooperative  Airway & Oxygen Therapy: Patient Spontanous Breathing and Patient connected to face mask oxygen  Post-op Assessment: Report given to RN, Post -op Vital signs reviewed and stable and Patient moving all extremities  Post vital signs: Reviewed and stable  Last Vitals:  Vitals Value Taken Time  BP    Temp    Pulse 109 04/30/2018  3:58 PM  Resp 21 04/30/2018  3:58 PM  SpO2 96 % 04/30/2018  3:58 PM  Vitals shown include unvalidated device data.  Last Pain:  Vitals:   04/30/18 1502  TempSrc:   PainSc: 5       Patients Stated Pain Goal: 4 (13/24/40 1027)  Complications: No apparent anesthesia complications

## 2018-04-30 NOTE — Op Note (Signed)
PATIENT:  Kathleen Henry  PRE-OPERATIVE DIAGNOSIS: Gross hematuria with bladder clots and clot retention  POST-OPERATIVE DIAGNOSIS: Same  PROCEDURE: Cystoscopy with evacuation of clots  SURGEON:  Claybon Jabs  INDICATION: Kathleen Henry is a  42 year old female patient who underwent a right partial nephrectomy on 04/17/2018.  She did well and was discharged but returned to the office today after having been to the emergency room for hematuria and clot retention.  I did attempt to irrigate her catheter in the ER was only partially successful.  She  presented to the office today with a distended bladder and suprapubic discomfort.  Attempts at irrigation were undertaken but the patient was quite uncomfortable and although she did receive a great deal of relief she was felt to still have clots in the bladder as clot evacuation.  Her catheter could not be irrigated freely.  She therefore is brought to the operating room for clot evacuation.    ANESTHESIA:  General  EBL:  Minimal  DRAINS: 24 [place] Ainsworth catheter with 20 cc in the balloon.  LOCAL MEDICATIONS USED:  None  SPECIMEN: None   Description of procedure: After informed consent the patient was taken to the operating room and placed on the table in a supine position. General anesthesia was then administered. Once fully anesthetized the patient was moved to the dorsal lithotomy position and the genitalia were sterilely prepped and draped in standard fashion. An official timeout was then performed.  I initially performed cystoscopy using the resectoscope with the visual obturator and 30 degree lens was inserted in the bladder and I noted a large amount of clot present.  I first tried using a Scientist, product/process development but found I was unable to evacuate a significant enough amount of clot so I switched to the [place] syringe and was able to evacuate a great deal of clot from the bladder.  It was somewhat tenacious.  I was however able to remove  all of the clot without difficulty and reinspection revealed all clot had been evacuated from the bladder.  I then observed her right ureteral orifice and noted a very small amount of blood emanating from the ureteral orifice.  The resectoscope was removed and I inserted a 24 [place] Ainsworth catheter into the bladder and filled the balloon with 20 cc of water.  This was congested to closed system drainage and the patient was awakened and taken to the recovery room in stable and satisfactory condition.  She tolerated the procedure well with no intraoperative complications.  PLAN OF CARE: Discharge to home after PACU  PATIENT DISPOSITION:  PACU - hemodynamically stable.

## 2018-04-30 NOTE — Anesthesia Preprocedure Evaluation (Signed)
Anesthesia Evaluation  Patient identified by MRN, date of birth, ID band Patient awake    Reviewed: Allergy & Precautions, NPO status , Patient's Chart, lab work & pertinent test results  Airway Mallampati: II  TM Distance: >3 FB Neck ROM: Full    Dental  (+) Teeth Intact, Dental Advisory Given   Pulmonary    breath sounds clear to auscultation       Cardiovascular  Rhythm:Regular Rate:Normal     Neuro/Psych    GI/Hepatic   Endo/Other    Renal/GU      Musculoskeletal   Abdominal   Peds  Hematology   Anesthesia Other Findings   Reproductive/Obstetrics                             Anesthesia Physical Anesthesia Plan  ASA: III  Anesthesia Plan: General   Post-op Pain Management:    Induction: Intravenous  PONV Risk Score and Plan: 1 and Ondansetron and Dexamethasone  Airway Management Planned: Oral ETT  Additional Equipment:   Intra-op Plan:   Post-operative Plan: Extubation in OR  Informed Consent: I have reviewed the patients History and Physical, chart, labs and discussed the procedure including the risks, benefits and alternatives for the proposed anesthesia with the patient or authorized representative who has indicated his/her understanding and acceptance.   Dental advisory given  Plan Discussed with: CRNA and Anesthesiologist  Anesthesia Plan Comments:         Anesthesia Quick Evaluation

## 2018-04-30 NOTE — H&P (Signed)
Patient ID: Kathleen Henry, female   DOB: 04-08-1976, 42 y.o.   MRN: [id number] This is an interval history note to the H&P done on 04/15/2018.  Typically there is a tab to the left side on epic that allows me to apply an interval history to the H&P however that tab does not exist at this time for this patient therefore this interval note serves as an update to the H&P done on 04/15/2018.  HPI: Kathleen Henry is a 42 year old female, referred by Dr. Pilar Jarvis, with a history of an incidentally identified 2.3 cm right renal mass seen on recent CT during an evaluation for hematochezia. She had a subsequent MRI on 02/19/18 that confirmed that the exophytic and heterogenous enhancing right renal mass had features concerning for malignancy.   Pathology- RCC pT1a, Fuhrman Grade II--negative surgical margins   She is s/p robotic right partial nephrectomy on 04/17/18. She presents to clinic today with intractable suprapubic discomfort following Foley catheter placement in the emergency room on 04/29/2018 secondary to gross hematuria. She had a CT of the abdomen/pelvis with contrast on 04/29/2018 that showed a very small perinephric hematoma with no evidence of active extravasation and enhancement of the renal parenchyma.   Her H/H dropped slightly to 11.7/34.8 from 12.5/38.8 post-op. Her H/H from 04/29/2018 was 13.1/40.3.     ALLERGIES: Aspirin - Other Reaction, Skin Rash, GI bleed NSAIDs - Other Reaction, GI bleed Penicillin - Skin Rash    MEDICATIONS: Bactrim Ds 800 mg-160 mg tablet 1 tablet PO BID  Levothyroxine Sodium 125 mcg tablet  Omeprazole  Flonase Allergy Relief  Multivitamin  Probiotic  Vitamin D3     GU PSH: Catheterize For Residual - 04/29/2018 Endometrial Ablation Partial nephrectomy (laparoscopic), Right - 04/17/2018    NON-GU PSH: Bilateral Tubal Ligation Cesarean Delivery    GU PMH: Gross hematuria (Acute), Culture cath UA. Empirically begin Bactrim DS 1 po BID X 7 days till culture complete.  Instructed to push po fluids and it contact office if she has any acute changes ie temp >100.5, passing large clot material, c/o dizziness/SOB/CP. Will check CBC today. Reassured that nothing acute at this time to warrant sending to ER or admission. VSS. - 04/29/2018 Renal cell carcinoma, right (Stable) - 2/67/1245 Right uncertain neoplasm of kidney - 03/04/2018    NON-GU PMH: Arrhythmia GERD Hypothyroidism    FAMILY HISTORY: 1 Daughter - Daughter 2 sons - Son Colon Cancer - Mother Diabetes - Runs in Family Heart Disease - Father Hypertension - Runs in Family leukemia - Grandfather mother deceased at age 35 - Mother stroke - Runs in Family   SOCIAL HISTORY: Marital Status: Married Preferred Language: English; Ethnicity: Not Hispanic Or Latino; Race: White Current Smoking Status: Patient has never smoked.   Tobacco Use Assessment Completed: Used Tobacco in last 30 days? Does not use smokeless tobacco. Has never drank.  Does not use drugs. Drinks 3 caffeinated drinks per day. Has not had a blood transfusion. 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    REVIEW OF SYSTEMS:    GU Review Female:   Patient denies frequent urination, hard to postpone urination, burning /pain with urination, get up at night to urinate, leakage of urine, stream starts and stops, trouble starting your stream, have to strain to urinate, and being pregnant.  Gastrointestinal (Upper):   Patient denies nausea, vomiting, and indigestion/ heartburn.  Gastrointestinal (Lower):   Patient denies diarrhea and constipation.  Constitutional:   Patient denies fever, night sweats, weight loss, and  fatigue.  Skin:   Patient denies skin rash/ lesion and itching.  Eyes:   Patient denies blurred vision and double vision.  Ears/ Nose/ Throat:   Patient denies sore throat and sinus problems.  Hematologic/Lymphatic:   Patient denies swollen glands and easy bruising.  Cardiovascular:   Patient denies leg swelling and chest pains.   Respiratory:   Patient denies cough and shortness of breath.  Endocrine:   Patient denies excessive thirst.  Musculoskeletal:   Patient denies back pain and joint pain.  Neurological:   Patient denies headaches and dizziness.  Psychologic:   Patient denies depression and anxiety.   VITAL SIGNS:      04/30/2018 12:02 PM  BP 128/89 mmHg  Pulse 121 /min  Temperature 98.1 F / 36.7 C   GU PHYSICAL EXAMINATION:    Bladder: 24 French Foley catheter in place with minimal output and dark bloody urine.   MULTI-SYSTEM PHYSICAL EXAMINATION:    Constitutional: Obese. Appears very uncomfortable.   Neck: Neck symmetrical, not swollen. Normal tracheal position.  Respiratory: No labored breathing, no use of accessory muscles.  Cardiovascular: Normal temperature, normal extremity pulses, no swelling, no varicosities.   Lymphatic: No enlargement of neck, axillae, groin.  Skin: No paleness, no jaundice, no cyanosis. No lesion, no ulcer, no rash.  Neurologic / Psychiatric: Oriented to time, oriented to place, oriented to person. No depression, no anxiety, no agitation.  Gastrointestinal: No mass, no tenderness, no rigidity, non obese abdomen.  Eyes: Normal conjunctivae. Normal eyelids.  Ears, Nose, Mouth, and Throat: Left ear no scars, no lesions, no masses. Right ear no scars, no lesions, no masses. Nose no scars, no lesions, no masses. Normal hearing. Normal lips.  Musculoskeletal: Normal gait and station of head and neck.     PAST DATA REVIEWED:  Source Of History:  Patient   PROCEDURES: None   ASSESSMENT:      ICD-10 Details  1 GU:   Renal cell carcinoma, right - C64.1   2   Gross hematuria - R31.0    PLAN:           Schedule Return Visit/Planned Activity: ASAP - Schedule Surgery          Document Letter(s):  Created for Patient: Clinical Summary         Notes:   -The patient's Foley catheter was extensively and irrigated draining approximately 500 mL of old well-formed blood clot.  I could not get her catheter to completely flush freely as the patient wasn't a tremendous amount of discomfort during manipulation of her catheter.   The risks, benefits and alternatives of cystoscopy with clot evacuation was discussed with the patient and her husband. The voice understanding and wish to proceed.

## 2018-04-30 NOTE — Discharge Instructions (Signed)

## 2018-04-30 NOTE — Anesthesia Procedure Notes (Signed)
Procedure Name: Intubation Date/Time: 04/30/2018 3:26 PM Performed by: Mitzie Na, CRNA Pre-anesthesia Checklist: Patient identified, Emergency Drugs available, Suction available, Patient being monitored and Timeout performed Patient Re-evaluated:Patient Re-evaluated prior to induction Oxygen Delivery Method: Circle system utilized Preoxygenation: Pre-oxygenation with 100% oxygen Induction Type: IV induction Laryngoscope Size: Mac and 3 Grade View: Grade I Tube type: Oral Tube size: 7.0 mm Number of attempts: 1 Airway Equipment and Method: Stylet Placement Confirmation: ETT inserted through vocal cords under direct vision,  positive ETCO2 and breath sounds checked- equal and bilateral Secured at: 22 cm Dental Injury: Teeth and Oropharynx as per pre-operative assessment

## 2018-05-01 ENCOUNTER — Encounter (HOSPITAL_COMMUNITY): Payer: Self-pay | Admitting: Urology

## 2018-05-04 NOTE — Anesthesia Postprocedure Evaluation (Signed)
Anesthesia Post Note  Patient: Kathleen Henry  Procedure(s) Performed: CYSTOSCOPY WITH EVACUATION (N/A )     Patient location during evaluation: PACU Anesthesia Type: General Level of consciousness: awake and alert Pain management: pain level controlled Vital Signs Assessment: post-procedure vital signs reviewed and stable Respiratory status: spontaneous breathing, nonlabored ventilation, respiratory function stable and patient connected to nasal cannula oxygen Cardiovascular status: blood pressure returned to baseline and stable Postop Assessment: no apparent nausea or vomiting Anesthetic complications: no    Last Vitals:  Vitals:   04/30/18 1615 04/30/18 1630  BP: 114/75 124/81  Pulse: (!) 104 (!) 103  Resp: (!) 21 (!) 24  Temp:  37.3 C  SpO2: 97% 99%    Last Pain:  Vitals:   05/01/18 0914  TempSrc:   PainSc: 0-No pain                 Zimri Brennen COKER

## 2018-05-12 DIAGNOSIS — E042 Nontoxic multinodular goiter: Secondary | ICD-10-CM | POA: Diagnosis not present

## 2018-05-12 DIAGNOSIS — E041 Nontoxic single thyroid nodule: Secondary | ICD-10-CM | POA: Diagnosis not present

## 2018-05-14 DIAGNOSIS — E039 Hypothyroidism, unspecified: Secondary | ICD-10-CM | POA: Diagnosis not present

## 2018-05-14 DIAGNOSIS — Z6841 Body Mass Index (BMI) 40.0 and over, adult: Secondary | ICD-10-CM | POA: Diagnosis not present

## 2018-05-14 DIAGNOSIS — Z85528 Personal history of other malignant neoplasm of kidney: Secondary | ICD-10-CM | POA: Diagnosis not present

## 2018-05-14 DIAGNOSIS — L219 Seborrheic dermatitis, unspecified: Secondary | ICD-10-CM | POA: Diagnosis not present

## 2018-05-14 DIAGNOSIS — Z0000 Encounter for general adult medical examination without abnormal findings: Secondary | ICD-10-CM | POA: Diagnosis not present

## 2018-05-14 MED FILL — KETOCONAZOLE 2% SHAMPOO: 2 | 30 days supply | Qty: 120 | Fill #0

## 2018-05-19 ENCOUNTER — Inpatient Hospital Stay: Payer: 59

## 2018-05-19 ENCOUNTER — Encounter: Payer: Self-pay | Admitting: Genetics

## 2018-05-19 ENCOUNTER — Inpatient Hospital Stay: Payer: 59 | Attending: Genetic Counselor | Admitting: Genetics

## 2018-05-19 DIAGNOSIS — Z806 Family history of leukemia: Secondary | ICD-10-CM | POA: Diagnosis not present

## 2018-05-19 DIAGNOSIS — C641 Malignant neoplasm of right kidney, except renal pelvis: Secondary | ICD-10-CM | POA: Diagnosis not present

## 2018-05-19 DIAGNOSIS — Z85528 Personal history of other malignant neoplasm of kidney: Secondary | ICD-10-CM | POA: Insufficient documentation

## 2018-05-19 DIAGNOSIS — N2889 Other specified disorders of kidney and ureter: Secondary | ICD-10-CM

## 2018-05-19 DIAGNOSIS — Z7183 Encounter for nonprocreative genetic counseling: Secondary | ICD-10-CM

## 2018-05-19 DIAGNOSIS — Z800 Family history of malignant neoplasm of digestive organs: Secondary | ICD-10-CM

## 2018-05-19 HISTORY — DX: Personal history of other malignant neoplasm of kidney: Z85.528

## 2018-05-19 NOTE — Progress Notes (Signed)
REFERRING PROVIDER: Ceasar Mons, MD [address] [place], [place] [address]  PRIMARY PROVIDER:  Hermine Messick, MD  PRIMARY REASON FOR VISIT:  1. Right renal mass   2. Family history of colon cancer   3. Family history of leukemia   4. Personal history of renal cell carcinoma     HISTORY OF PRESENT ILLNESS:   Ms. Kathleen Henry, a 42 y.o. female, was seen for a [facility] cancer genetics consultation at the request of Dr. Lovena Neighbours due to a personal history of renal cell carcinoma dx under 57.   Ms. Wieczorek presents to clinic today to discuss the possibility of a hereditary predisposition to cancer, genetic testing, and to further clarify her future cancer risks, as well as potential cancer risks for family members.   On 04/18/2018, at the age of 56, Ms. Norrington was diagnosed with renal cell carcinoma, clear cell histology.  She underwent right partial nephrectomy, margins were negative.     HORMONAL RISK FACTORS:  Menarche was at age 34.  First live birth at age 10.   Ovaries intact: yes.  Hysterectomy: no. Had endometiral ablation.  Menopausal status: premenopausal.  HRT use: 0 years. Colonoscopy: yes; Jan 2018, reports having a few polyps and diverticulitis.  told to come back for an toher in 5 years.. Mammogram within the last year: yes. Number of breast biopsies: 0.  Past Medical History:  Diagnosis Date  . AMA (advanced maternal age) multigravida 13+   . Anemia   . Benign cyst of skin 2014   To neck- removed  . Cancer ([place])    kidney cancer, clear cell  . Family history of colon cancer   . Family history of leukemia   . GERD (gastroesophageal reflux disease)   . History of PCOS   . Hypothyroidism   . Palpitations    occasional   . Personal history of renal cell carcinoma 05/19/2018  . PONV (postoperative nausea and vomiting)    with epidurals had " shakes" afterwards   . Sinus tachycardia   . Yeast infection    hx of in creases of legs per patient, most  recent 04/10/2018 patient had to take diflucan x 1 and nystatin cream with clearing.     Past Surgical History:  Procedure Laterality Date  . CESAREAN SECTION    . COLONOSCOPY    . CYSTOSCOPY WITH FULGERATION N/A 04/30/2018   Procedure: CYSTOSCOPY WITH EVACUATION;  Surgeon: Kathie Rhodes, MD;  Location: [facility];  Service: Urology;  Laterality: N/A;  . DILATION AND CURETTAGE OF UTERUS  12/05/2016  . ENDOMETRIAL ABLATION  12/05/2016  . RENAL MASS EXCISION    . ROBOTIC ASSITED PARTIAL NEPHRECTOMY Right 04/17/2018   Procedure: XI ROBOTIC ASSITED PARTIAL NEPHRECTOMY;  Surgeon: Ceasar Mons, MD;  Location: [facility];  Service: Urology;  Laterality: Right;  . TUBAL LIGATION  12/05/2016    Social History   Socioeconomic History  . Marital status: Married    Spouse name: Not on file  . Number of children: Not on file  . Years of education: Not on file  . Highest education level: Not on file  Occupational History  . Not on file  Social Needs  . Financial resource strain: Not on file  . Food insecurity:    Worry: Not on file    Inability: Not on file  . Transportation needs:    Medical: Not on file    Non-medical: Not on file  Tobacco Use  . Smoking  status: Never Smoker  . Smokeless tobacco: Never Used  Substance and Sexual Activity  . Alcohol use: No  . Drug use: No  . Sexual activity: Yes  Lifestyle  . Physical activity:    Days per week: Not on file    Minutes per session: Not on file  . Stress: Not on file  Relationships  . Social connections:    Talks on phone: Not on file    Gets together: Not on file    Attends religious service: Not on file    Active member of club or organization: Not on file    Attends meetings of clubs or organizations: Not on file    Relationship status: Not on file  Other Topics Concern  . Not on file  Social History Narrative  . Not on file     FAMILY HISTORY:  We obtained a detailed, 4-generation family history.  Significant  diagnoses are listed below: Family History  Problem Relation Age of Onset  . Hypertension Mother   . Cancer Mother        colon  . CAD Father   . Hypertension Father   . Diabetes Father        Type 2  . Thyroid disease Father   . Kidney disease Father   . Diabetes Daughter        Type 1  . Cancer Paternal Grandmother        liver  . Cancer Paternal Grandfather        leaukemia   Ms. Paternostro has 2 sons (ages 58 and 58) and a daughter (77).  Ms. Northrup has no siblings.    Ms. Delucchi father: 33, no history of cancer.  He has had thyroid issues and recently had  Thyroid biopsy.   Paternal Aunts/Uncles: 3 paternal aunts, 1 paternal uncle.  No history of cancer.  1 paternal aunt died as a baby.  Ms. Bickhart also has 3 half-uncles and a half-aunt with no history of cancer. One of these half-uncles died as a child due to polio. The half-aunts/uncles are through Ms. Sasso's paternal grandfather.  Paternal cousins: 1 paternal cousin (full uncle's daughter) had cancer on her leg in her 19's.  Paternal grandfather: died in his 69's, had leukemia.  Paternal grandmother:died of liver cancer in her early 71's.   Ms. Tugwell mother: died at 77 due to colon cancer that was diagnsoed at 34.  She also had a large meningioma removed in her 58's.  Maternal Aunts/Uncles: none Maternal cousins: none Maternal grandfather: died of a stroke at 69 Maternal grandmother:died of a stroke at 51  Ms. Facer is unaware of previous family history of genetic testing for hereditary cancer risks. Patient's maternal ancestors are of [place] European descent, and paternal ancestors are of [place] descent. There is no reported Ashkenazi Jewish ancestry. There is no known consanguinity.  GENETIC COUNSELING ASSESSMENT: TAYLAH DUBIEL is a 42 y.o. female with a personal and family history which is somewhat suggestive of a Hereditary Cancer Predisposition Syndrome. We, therefore, discussed and recommended the  following at today's visit.   DISCUSSION: We reviewed the characteristics, features and inheritance patterns of hereditary cancer syndromes. We also discussed genetic testing, including the appropriate family members to test, the process of testing, insurance coverage and turn-around-time for results. We discussed the implications of a negative, positive and/or variant of uncertain significant result. We recommended Ms. Sumpter pursue genetic testing for the Common Hereditary Cancers gene panel + Renal/Urinary Tract cancers  panel.   The Common Hereditary Cancer Panel offered by [facility] includes sequencing and/or deletion duplication testing of the following 47 genes: APC, ATM, AXIN2, BARD1, BMPR1A, BRCA1, BRCA2, BRIP1, CDH1, CDKN2A (p14ARF), CDKN2A (p16INK4a), CKD4, CHEK2, CTNNA1, DICER1, EPCAM (Deletion/duplication testing only), GREM1 (promoter region deletion/duplication testing only), KIT, MEN1, MLH1, MSH2, MSH3, MSH6, MUTYH, NBN, NF1, NHTL1, PALB2, PDGFRA, PMS2, POLD1, POLE, PTEN, RAD50, RAD51C, RAD51D, SDHB, SDHC, SDHD, SMAD4, SMARCA4. STK11, TP53, TSC1, TSC2, and VHL.  The following genes were evaluated for sequence changes only: SDHA and HOXB13 c.251G>A variant only.  Renal/Urinary Tract Cancers Panel:  BAP1 CDC73 CDKN1C DICER1 DIS3L2 EPCAM FH FLCN GPC3 MET MLH1 MSH2 MSH6 PMS2 PTEN SDHB SDHC SMARCA4 SMARCB1 TP53 TSC1 TSC2 VHL WT1   We discussed that only 5-10% of cancers are associated with a Hereditary predisposition to cancer.  There are several different genes and syndromes associated with increased risk for renal cell carcinoma. Each of these genes has its own set of cancer risks that can be addressed should she be positive (ex kidney cancer and melanoma, kidney cancer and colon/thyroid cancer, etc). There are also many other genes associated with many other types of cancer (colon, breast, gyn, etc).   We discussed that if she is found to have a mutation in one of these genes, it may impact  future medical management recommendations such as increased cancer screenings and consideration of risk reducing surgeries.  A positive result could also have implications for the patient's family members.  A Negative result would mean we were unable to identify a hereditary component to her cancer, but does not rule out the possibility of a hereditary basis for her  cancer.  There could be mutations that are undetectable by current technology, or in genes not yet tested or identified to increase cancer risk.    We discussed the potential to find a Variant of Uncertain Significance or VUS.  These are variants that have not yet been identified as pathogenic or benign, and it is unknown if this variant is associated with increased cancer risk or if this is a normal finding.  Most VUS's are reclassified to benign or likely benign.   It should not be used to make medical management decisions. With time, we suspect the lab will determine the significance of any VUS's identified if any.   Based on Ms. Coxe's personal and family history of cancer, we would recommend testing (RCC under 64). Despite our recommendation, she may still have an out of pocket cost. We discussed that the laboratory can provide her with an estimate of her OOP cost. They can also offer her a patient pay price of $250.   PLAN: After considering the risks, benefits, and limitations, Ms. Tsou  provided informed consent to pursue genetic testing and the blood sample was sent to [facility] for analysis of the Common Hereditary Cancers panel + Renal/ Urinary Tract Cancers panel. Results should be available within approximately 2-3 weeks' time, at which point they will be disclosed by telephone to Ms. Paynter, as will any additional recommendations warranted by these results. Ms. Strada will receive a summary of her genetic counseling visit and a copy of her results once available. This information will also be available in Epic. We  encouraged Ms. Gupta to remain in contact with cancer genetics annually so that we can continuously update the family history and inform her of any changes in cancer genetics and testing that may be of benefit for her family. Ms. Carby questions were  answered to her satisfaction today. Our contact information was provided should additional questions or concerns arise.  Lastly, we encouraged Ms. Hawn to remain in contact with cancer genetics annually so that we can continuously update the family history and inform her of any changes in cancer genetics and testing that may be of benefit for this family.   Ms.  Schwinn questions were answered to her satisfaction today. Our contact information was provided should additional questions or concerns arise. Thank you for the referral and allowing [place] to share in the care of your patient.   Tana Felts, MS, [facility] Certified Genetic Counselor [email or web address] phone: [phone]  The patient was seen for a total of 35 minutes in face-to-face genetic counseling.   This patient was discussed with Drs. Magrinat, Lindi Adie and/or Burr Medico who agrees with the above.

## 2018-05-20 MED FILL — LEVOTHYROXINE 112 MCG TAB: 112 | 30 days supply | Qty: 30 | Fill #0

## 2018-05-21 DIAGNOSIS — Z9889 Other specified postprocedural states: Secondary | ICD-10-CM | POA: Diagnosis not present

## 2018-05-21 DIAGNOSIS — M62838 Other muscle spasm: Secondary | ICD-10-CM | POA: Diagnosis not present

## 2018-05-25 ENCOUNTER — Telehealth: Payer: Self-pay | Admitting: *Deleted

## 2018-05-25 NOTE — Telephone Encounter (Signed)
Faxed ROI to [facility]; release [phone]

## 2018-05-30 ENCOUNTER — Emergency Department (HOSPITAL_COMMUNITY)
Admission: EM | Admit: 2018-05-30 | Discharge: 2018-05-30 | Disposition: A | Discharge status: Home / Self Care | Payer: 59 | Attending: Emergency Medicine | Admitting: Emergency Medicine

## 2018-05-30 ENCOUNTER — Encounter (HOSPITAL_COMMUNITY): Payer: Self-pay | Admitting: *Deleted

## 2018-05-30 DIAGNOSIS — Z79899 Other long term (current) drug therapy: Secondary | ICD-10-CM | POA: Insufficient documentation

## 2018-05-30 DIAGNOSIS — R319 Hematuria, unspecified: Secondary | ICD-10-CM | POA: Diagnosis present

## 2018-05-30 DIAGNOSIS — Z85528 Personal history of other malignant neoplasm of kidney: Secondary | ICD-10-CM | POA: Diagnosis not present

## 2018-05-30 DIAGNOSIS — N939 Abnormal uterine and vaginal bleeding, unspecified: Secondary | ICD-10-CM | POA: Diagnosis not present

## 2018-05-30 DIAGNOSIS — E039 Hypothyroidism, unspecified: Secondary | ICD-10-CM | POA: Insufficient documentation

## 2018-05-30 LAB — URINALYSIS, ROUTINE W REFLEX MICROSCOPIC
Bilirubin Urine: NEGATIVE
Glucose, UA: NEGATIVE mg/dL
KETONES UR: NEGATIVE mg/dL
Nitrite: NEGATIVE
PH: 6 (ref 5.0–8.0)
Protein, ur: NEGATIVE mg/dL
Specific Gravity, Urine: 1.006 (ref 1.005–1.030)

## 2018-05-30 LAB — PREGNANCY, URINE: Preg Test, Ur: NEGATIVE

## 2018-05-30 NOTE — ED Provider Notes (Signed)
[facility] DEPT Provider Note   CSN: [id number] Arrival date & time: 05/30/18  3149     History   Chief Complaint Chief Complaint  Patient presents with  . Hematuria    HPI Kathleen Henry is a 42 y.o. female.  42 yo F with a cc of hematuria.  Noticed when she wiped.  Not grossly bloody.  Hx of renal cell carcinoma.  With nephrectomy.  The patient had an issue of a bleeding into the ureter which caused her to have a cystoscopy to clean out the blood in her bladder.  Since then the patient has been doing much better.  She has had some transient abdominal wall spasms which have improved.  She gets transient right flank pain but not having pain currently.  Denies fevers or chills denies vomiting.  She is unsure if this is vaginal bleeding or if it is coming from her urethra.  Old records reviewed patient had a recent visit a couple weeks ago for abdominal wall spasms.  This is significantly improved.  The history is provided by the patient.  Hematuria  This is a new problem. The current episode started yesterday. The problem occurs constantly. The problem has not changed since onset.Pertinent negatives include no chest pain, no headaches and no shortness of breath. Nothing aggravates the symptoms. Nothing relieves the symptoms. She has tried nothing for the symptoms. The treatment provided no relief.    Past Medical History:  Diagnosis Date  . AMA (advanced maternal age) multigravida 65+   . Anemia   . Benign cyst of skin 2014   To neck- removed  . Cancer ([place])    kidney cancer, clear cell  . Family history of colon cancer   . Family history of leukemia   . GERD (gastroesophageal reflux disease)   . History of PCOS   . Hypothyroidism   . Palpitations    occasional   . Personal history of renal cell carcinoma 05/19/2018  . PONV (postoperative nausea and vomiting)    with epidurals had " shakes" afterwards   . Sinus tachycardia   . Yeast infection     hx of in creases of legs per patient, most recent 04/10/2018 patient had to take diflucan x 1 and nystatin cream with clearing.     Patient Active Problem List   Diagnosis Date Noted  . Personal history of renal cell carcinoma 05/19/2018  . Family history of colon cancer   . Family history of leukemia   . Right renal mass 04/17/2018  . Term pregnancy 06/14/2015  . [redacted] weeks gestation of pregnancy   . Evaluate anatomy not seen on prior sonogram   . Maternal morbid obesity, antepartum ([place])   . Hypothyroidism affecting pregnancy in second trimester, antepartum   . [redacted] weeks gestation of pregnancy   . Advanced maternal age in multigravida   . AMA (advanced maternal age) multigravida 55+   . Encounter for fetal anatomic survey   . Obesity affecting pregnancy in second trimester, antepartum     Past Surgical History:  Procedure Laterality Date  . CESAREAN SECTION    . COLONOSCOPY    . CYSTOSCOPY WITH FULGERATION N/A 04/30/2018   Procedure: CYSTOSCOPY WITH EVACUATION;  Surgeon: Kathie Rhodes, MD;  Location: [facility] ORS;  Service: Urology;  Laterality: N/A;  . DILATION AND CURETTAGE OF UTERUS  12/05/2016  . ENDOMETRIAL ABLATION  12/05/2016  . RENAL MASS EXCISION    . ROBOTIC ASSITED PARTIAL NEPHRECTOMY Right 04/17/2018  Procedure: XI ROBOTIC ASSITED PARTIAL NEPHRECTOMY;  Surgeon: Ceasar Mons, MD;  Location: [facility] ORS;  Service: Urology;  Laterality: Right;  . TUBAL LIGATION  12/05/2016     OB History    Gravida  3   Para  3   Term  3   Preterm      AB      Living  3     SAB      TAB      Ectopic      Multiple  0   Live Births  3            Home Medications    Prior to Admission medications   Medication Sig Start Date End Date Taking? Authorizing Provider  acetaminophen (TYLENOL) 500 MG tablet Take 1,000 mg by mouth every 6 (six) hours as needed for moderate pain.    [provider]  docusate sodium (COLACE) 100 MG capsule Take 1 capsule  (100 mg total) by mouth 2 (two) times daily as needed for mild constipation. 04/17/18 04/17/19  Ceasar Mons, MD  fluconazole (DIFLUCAN) 100 MG tablet Take 150 mg by mouth daily.  04/10/18   [provider]  fluticasone (FLONASE) 50 MCG/ACT nasal spray Place 1 spray into both nostrils daily.     [provider]  HYDROcodone-acetaminophen (NORCO) 5-325 MG tablet Take 1 tablet by mouth every 4 (four) hours as needed for moderate pain. 04/17/18   Ceasar Mons, MD  HYDROcodone-acetaminophen (NORCO/VICODIN) 5-325 MG tablet Take 1 tablet by mouth every 4 (four) hours as needed for moderate pain. Patient not taking: Reported on 04/30/2018 04/29/18   Jola Schmidt, MD  levothyroxine (SYNTHROID, LEVOTHROID) 125 MCG tablet Take 125 mcg by mouth daily before breakfast.    [provider]  mirabegron ER (MYRBETRIQ) 50 MG TB24 tablet Take 1 tablet (50 mg total) by mouth daily. 04/30/18   Kathie Rhodes, MD  omeprazole (PRILOSEC) 20 MG capsule Take 20 mg by mouth daily.    [provider]  ondansetron (ZOFRAN ODT) 8 MG disintegrating tablet Take 1 tablet (8 mg total) by mouth every 8 (eight) hours as needed for nausea or vomiting. 04/29/18   Jola Schmidt, MD  ondansetron (ZOFRAN) 4 MG tablet Take 1 tablet (4 mg total) by mouth daily as needed for nausea or vomiting. Patient not taking: Reported on 04/30/2018 04/17/18 04/17/19  Ceasar Mons, MD  oxybutynin (DITROPAN) 5 MG tablet Take 1 tablet (5 mg total) by mouth 2 (two) times daily. 04/29/18   Jola Schmidt, MD  Prenatal Vit-Fe Fumarate-FA (PRENATAL MULTIVITAMIN) TABS tablet Take 1 tablet by mouth daily.     [provider]  Probiotic Product (PROBIOTIC DAILY PO) Take 1 capsule by mouth daily.    [provider]  propranolol (INDERAL) 10 MG tablet Take 10 mg by mouth 2 (two) times daily as needed (heart palpations- patient states she has not taken in over a year).  05/26/14   [provider]  VITAMIN D, CHOLECALCIFEROL, PO Take 5,000 Units by mouth daily.     [provider]    Family History Family History  Problem Relation Age of Onset  . Hypertension Mother   . Cancer Mother        colon  . CAD Father   . Hypertension Father   . Diabetes Father        Type 2  . Thyroid disease Father   . Kidney disease Father   . Diabetes  Daughter        Type 1  . Cancer Paternal Grandmother        liver  . Cancer Paternal Grandfather        leaukemia    Social History Social History   Tobacco Use  . Smoking status: Never Smoker  . Smokeless tobacco: Never Used  Substance Use Topics  . Alcohol use: No  . Drug use: No     Allergies   Aspirin; Ibuprofen; Lexapro [escitalopram]; and Penicillins   Review of Systems Review of Systems  Constitutional: Negative for chills and fever.  HENT: Negative for congestion and rhinorrhea.   Eyes: Negative for redness and visual disturbance.  Respiratory: Negative for shortness of breath and wheezing.   Cardiovascular: Negative for chest pain and palpitations.  Gastrointestinal: Negative for nausea and vomiting.  Genitourinary: Positive for hematuria. Negative for dysuria and urgency.  Musculoskeletal: Negative for arthralgias and myalgias.  Skin: Negative for pallor and wound.  Neurological: Negative for dizziness and headaches.     Physical Exam Updated Vital Signs BP (!) 157/107 (BP Location: Right Arm)   Pulse (!) 104   Temp 97.7 F (36.5 C) (Oral)   Resp 18   Ht 5\' 7"  (1.702 m)   Wt 133.8 kg (295 lb)   SpO2 100%   BMI 46.20 kg/m   Physical Exam  Constitutional: She is oriented to person, place, and time. She appears well-developed and well-nourished. No distress.  obese  HENT:  Head: Normocephalic and atraumatic.  Eyes: Pupils are equal, round, and reactive to light. EOM are normal.  Neck: Normal range of motion. Neck supple.  Cardiovascular: Normal rate and regular rhythm. Exam  reveals no gallop and no friction rub.  No murmur heard. Pulmonary/Chest: Effort normal. She has no wheezes. She has no rales.  Abdominal: Soft. She exhibits no distension and no mass. There is no tenderness. There is no guarding.  Genitourinary:  Genitourinary Comments: Blood in vault, blood at os  Musculoskeletal: She exhibits no edema or tenderness.  Neurological: She is alert and oriented to person, place, and time.  Skin: Skin is warm and dry. She is not diaphoretic.  Psychiatric: She has a normal mood and affect. Her behavior is normal.  Nursing note and vitals reviewed.    ED Treatments / Results  Labs (all labs ordered are listed, but only abnormal results are displayed) Labs Reviewed  URINALYSIS, [place], URINE    EKG None  Radiology No results found.  Procedures Procedures (including critical care time)  Medications Ordered in ED Medications - No data to display   Initial Impression / Assessment and Plan / ED Course  I have reviewed the triage vital signs and the nursing notes.  Pertinent labs & imaging results that were available during my care of the patient were reviewed by me and considered in my medical decision making (see chart for details).     42 yo F with a chief complaint of hematuria.  Will check a UA.  We will do a pelvic exam.  Pelvic exam with blood in the vault and blood at the cervical os.  We will discharge patient home.  UA was contaminated did not feel strongly that I need to treat for UTI.    8:50 AM:  I have discussed the diagnosis/risks/treatment options with the patient and family and believe the pt to be eligible for discharge home to follow-up with PCP, GYN, urology. We also discussed returning to  the ED immediately if new or worsening sx occur. We discussed the sx which are most concerning (e.g., sudden worsening pain, fever, inability to tolerate by mouth) that necessitate immediate return. Medications  administered to the patient during their visit and any new prescriptions provided to the patient are listed below.  Medications given during this visit Medications - No data to display    The patient appears reasonably screen and/or stabilized for discharge and I doubt any other medical condition or other [facility] requiring further screening, evaluation, or treatment in the ED at this time prior to discharge.   Final Clinical Impressions(s) / ED Diagnoses   Final diagnoses:  None    ED Discharge Orders    None       Deno Etienne, DO 05/30/18 [phone]

## 2018-05-30 NOTE — ED Triage Notes (Signed)
Pt reports blood in urine since this morning. Pt noticed blood on tissue when she wiped. Pt had partial nephrectomy last month and had blood clots removed from urine. Pt states she has not had period d/t endometrial ablation.

## 2018-05-30 NOTE — Discharge Instructions (Signed)
Follow up with your OBGYN as needed.  Return for worsening symptoms.

## 2018-05-30 NOTE — ED Notes (Signed)
Pt unable to sign due to E-Signature pad not working. 

## 2018-06-01 ENCOUNTER — Telehealth: Payer: Self-pay | Admitting: Genetics

## 2018-06-05 DIAGNOSIS — C641 Malignant neoplasm of right kidney, except renal pelvis: Secondary | ICD-10-CM | POA: Diagnosis not present

## 2018-06-08 ENCOUNTER — Other Ambulatory Visit: Payer: 59

## 2018-06-08 ENCOUNTER — Encounter: Payer: 59 | Admitting: Genetic Counselor

## 2018-06-09 ENCOUNTER — Ambulatory Visit: Payer: Self-pay | Admitting: Genetics

## 2018-06-09 DIAGNOSIS — Z1379 Encounter for other screening for genetic and chromosomal anomalies: Secondary | ICD-10-CM | POA: Insufficient documentation

## 2018-06-15 ENCOUNTER — Ambulatory Visit: Payer: Self-pay | Admitting: Genetics

## 2018-06-15 ENCOUNTER — Encounter: Payer: Self-pay | Admitting: Genetics

## 2018-06-15 DIAGNOSIS — Z85528 Personal history of other malignant neoplasm of kidney: Secondary | ICD-10-CM

## 2018-06-15 DIAGNOSIS — Z806 Family history of leukemia: Secondary | ICD-10-CM

## 2018-06-15 DIAGNOSIS — Z1379 Encounter for other screening for genetic and chromosomal anomalies: Secondary | ICD-10-CM

## 2018-06-15 DIAGNOSIS — N2889 Other specified disorders of kidney and ureter: Secondary | ICD-10-CM

## 2018-06-15 DIAGNOSIS — Z800 Family history of malignant neoplasm of digestive organs: Secondary | ICD-10-CM

## 2018-06-15 NOTE — Progress Notes (Signed)
HPI:  Ms. Hendrie was previously seen in the [facility] clinic on 05/19/2018 due to a personal history of renal cell carcinoma and concerns regarding a hereditary predisposition to cancer. Please refer to our prior cancer genetics clinic note for more information regarding Ms. Nerio's medical, social and family histories, and our assessment and recommendations, at the time. Ms. Marschke recent genetic test results were disclosed to her, as well as recommendations warranted by these results. These results and recommendations are discussed in more detail below.  CANCER HISTORY:  On 04/18/2018, at the age of 42, Ms. Bauernfeind was diagnosed with renal cell carcinoma, clear cell histology.  She underwent right partial nephrectomy, margins were negative.     FAMILY HISTORY:  We obtained a detailed, 4-generation family history.  Significant diagnoses are listed below: Family History  Problem Relation Age of Onset  . Hypertension Mother   . Cancer Mother        colon  . CAD Father   . Hypertension Father   . Diabetes Father        Type 2  . Thyroid disease Father   . Kidney disease Father   . Diabetes Daughter        Type 1  . Cancer Paternal Grandmother        liver  . Cancer Paternal Grandfather        leaukemia    Ms. Vanwieren has 2 sons (ages 74 and 38) and a daughter (5).  Ms. Massi has no siblings.    Ms. Mabee father: 100, no history of cancer.  He has had thyroid issues and recently had  Thyroid biopsy.   Paternal Aunts/Uncles: 3 paternal aunts, 1 paternal uncle.  No history of cancer.  1 paternal aunt died as a baby.  Ms. Balik also has 3 half-uncles and a half-aunt with no history of cancer. One of these half-uncles died as a child due to polio. The half-aunts/uncles are through Ms. Nobis's paternal grandfather.  Paternal cousins: 1 paternal cousin (full uncle's daughter) had cancer on her leg in her 74's.  Paternal grandfather: died in his 17's, had leukemia.  Paternal  grandmother:died of liver cancer in her early 31's.   Ms. Spizzirri mother: died at 47 due to colon cancer that was diagnsoed at 35.  She also had a large meningioma removed in her 55's.  Maternal Aunts/Uncles: none Maternal cousins: none Maternal grandfather: died of a stroke at 80 Maternal grandmother:died of a stroke at 84  Ms. Mercier is unaware of previous family history of genetic testing for hereditary cancer risks. Patient's maternal ancestors are of [place] European descent, and paternal ancestors are of [place] descent. There is no reported Ashkenazi Jewish ancestry. There is no known consanguinity.  GENETIC TEST RESULTS: Genetic testing performed through [facility] Common Hereditary Cancers Panel + Renal/Urinary Tract Cancers Panel reported out on 05/30/2018 showed no pathogenic mutations. The following genes were evaluated for sequence changes and exonic deletions/duplications: APC, ATM, AXIN2, BAP1, BARD1, BMPR1A, BRCA1, BRCA2, BRIP1, BUB1B, CDC73, CDH1, CDK4, CDKN1C, CDKN2A (p14ARF), CDKN2A (p16INK4a), CEP57, CHEK2, CTNNA1, DICER1, DIS3L2, EPCAM*, FH, FLCN, GPC3, GREM1*, KIT, MEN1, MET, MLH1, MSH2, MSH3, MSH6, MUTYH, NBN, NF1, PALB2, PDGFRA, PMS2, POLD1, POLE, PTEN, RAD50, RAD51C, RAD51D, SDHB, SDHC, SDHD, SMAD4, SMARCA4, SMARCB1, STK11, TP53, TSC1, TSC2, VHL, WT1. The following genes were evaluated for sequence changes only: HOXB13*, MITF*, NTHL1*, SDHA  A variant of uncertain significance (VUS) in a gene called APC was also noted. c.[id number] (p.Gln1406Arg) A variant  of uncertain significance (VUS) in a gene called DICER1 was also noted. c.299T>G (p.Val100Gly)  The test report will be scanned into EPIC and will be located under the Molecular Pathology section of the Results Review tab. A portion of the result report is included below for reference.       We discussed with Ms. Gautreau that because current genetic testing is not perfect, it is possible there may be a gene  mutation in one of these genes that current testing cannot detect, but that chance is small.  We also discussed, that there could be another gene that has not yet been discovered, or that we have not yet tested, that is responsible for the cancer diagnoses in the family. It is also possible there is a hereditary cause for the cancer in the family that Ms. Staron did not inherit and therefore was not identified in her testing.  Therefore, it is important to remain in touch with cancer genetics in the future so that we can continue to offer Ms. Buch the most up to date genetic testing.   Regarding the VUS's in APC and DICER1: At this time, it is unknown if these variants are associated with increased cancer risk or if they are normal findings, but most variants such as these get reclassified to being inconsequential. They should not be used to make medical management decisions. With time, we suspect the lab will determine the significance of these variants, if any. If we do learn more about them, we will try to contact Ms. Maguire to discuss it further. However, it is important to stay in touch with [place] periodically and keep the address and phone number up to date.  ADDITIONAL GENETIC TESTING: We discussed with Ms. Martha that there are other genes that are associated with increased cancer risk that can be analyzed. The laboratories that offer this testing look at these additional genes via a hereditary cancer gene panel. Should Ms. Tempesta wish to pursue additional genetic testing, we are happy to discuss and coordinate this testing, at any time.    CANCER SCREENING RECOMMENDATIONS: Ms. Navratil test result is considered negative (normal).  This means that we have not identified a hereditary cause for personal and family history of cancer at this time. This result indicates that it is unlikely Ms. Klinger has an increased risk for a future cancer due to a mutation in one of these genes. This normal test also  suggests that Ms. Coey's cancer was most likely not due to an inherited predisposition associated with one of these genes.   While reassuring, this does not definitively rule out a hereditary predisposition to cancer. It is still possible that there could be genetic mutations that are undetectable by current technology, or genetic mutations in genes that have not been tested or identified to increase cancer risk.  Therefore, it is recommended she continue to follow the cancer management and screening guidelines provided by her oncology and primary healthcare provider. An individual's cancer risk is not determined by genetic test results alone.  Overall cancer risk assessment includes additional factors such as personal medical history, family history, etc.  These should be used to make a personalized plan for cancer prevention and surveillance.    Based on her mother's history of colon cancer dx at 72, we recommend she have colonoscopies every 5 years (or as directed by her GI physcian)   RECOMMENDATIONS FOR FAMILY MEMBERS:  Relatives in this family might be at some increased risk of developing  cancer, over the general population risk, simply due to the family history of cancer.  We recommended women in this family have a yearly mammogram beginning at age 58, or 79 years younger than the earliest onset of cancer, an annual clinical breast exam, and perform monthly breast self-exams. Women in this family should also have a gynecological exam as recommended by their primary provider. All family members should have a colonoscopy by age 15 (or as directed by their doctors).  All family members should inform their physicians about the family history of cancer so their doctors can make the most appropriate screening recommendations for them.   It is also possible there is a hereditary cause for the cancer in Ms. Sammons's family that she did not inherit and therefore was not identified in her.    FOLLOW-UP:  Lastly, we discussed with Ms. Barriere that cancer genetics is a rapidly advancing field and it is possible that new genetic tests will be appropriate for her and/or her family members in the future. We encouraged her to remain in contact with cancer genetics on an annual basis so we can update her personal and family histories and let her know of advances in cancer genetics that may benefit this family.   Our contact number was provided. Ms. Baskins questions were answered to her satisfaction, and she knows she is welcome to call us at anytime with additional questions or concerns.   Ferol Luz, MS, [facility] Certified Genetic Counselor [email or web address]

## 2018-06-15 NOTE — Telephone Encounter (Signed)
Revealed negative genetic testing.  Revealed that a VUS's in [place] and APC were identified.   This normal result is reassuring and indicates that it is unlikely Kathleen Henry's cancer is due to a hereditary cause.  It is unlikely that there is an increased risk of another cancer due to a mutation in one of these genes.  However, genetic testing is not perfect, and cannot definitively rule out a hereditary cause.  It will be important for her to keep in contact with genetics to learn if any additional testing may be needed in the future.      Recommended she continue to have colonoscopies every 5 years or as directed by her doctors due to her mother's hx of colon cancer.

## 2018-06-17 MED FILL — LEVOTHYROXINE 112 MCG TAB: 112 | 30 days supply | Qty: 30 | Fill #1

## 2018-07-06 MED FILL — OMEPRAZOLE 20 MG CPDR: 20 | 90 days supply | Qty: 90 | Fill #0

## 2018-07-20 MED FILL — LEVOTHYROXINE 112 MCG TAB: 112 | 30 days supply | Qty: 30 | Fill #0

## 2018-08-03 DIAGNOSIS — R5383 Other fatigue: Secondary | ICD-10-CM | POA: Diagnosis not present

## 2018-08-03 DIAGNOSIS — E039 Hypothyroidism, unspecified: Secondary | ICD-10-CM | POA: Diagnosis not present

## 2018-08-10 ENCOUNTER — Ambulatory Visit (HOSPITAL_COMMUNITY)
Admission: RE | Admit: 2018-08-10 | Discharge: 2018-08-10 | Disposition: A | Discharge status: Home / Self Care | Payer: 59 | Source: Ambulatory Visit | Attending: Urology | Admitting: Urology

## 2018-08-10 ENCOUNTER — Other Ambulatory Visit (HOSPITAL_COMMUNITY): Payer: Self-pay | Admitting: Urology

## 2018-08-10 DIAGNOSIS — C641 Malignant neoplasm of right kidney, except renal pelvis: Secondary | ICD-10-CM | POA: Diagnosis not present

## 2018-08-10 DIAGNOSIS — C649 Malignant neoplasm of unspecified kidney, except renal pelvis: Secondary | ICD-10-CM | POA: Diagnosis not present

## 2018-08-10 DIAGNOSIS — Z85528 Personal history of other malignant neoplasm of kidney: Secondary | ICD-10-CM | POA: Diagnosis not present

## 2018-08-18 MED FILL — LEVOTHYROXINE 112 MCG TAB: 112 | 30 days supply | Qty: 30 | Fill #0

## 2018-08-24 DIAGNOSIS — R310 Gross hematuria: Secondary | ICD-10-CM | POA: Diagnosis not present

## 2018-08-24 DIAGNOSIS — C641 Malignant neoplasm of right kidney, except renal pelvis: Secondary | ICD-10-CM | POA: Diagnosis not present

## 2018-09-03 DIAGNOSIS — Z23 Encounter for immunization: Secondary | ICD-10-CM | POA: Diagnosis not present

## 2018-09-16 MED FILL — LEVOTHYROXINE 112 MCG TAB: 112 | 30 days supply | Qty: 30 | Fill #1

## 2018-09-30 MED FILL — OMEPRAZOLE 20 MG CPDR: 20 | 90 days supply | Qty: 90 | Fill #1

## 2018-10-13 MED FILL — LEVOTHYROXINE 112 MCG TAB: 112 | 30 days supply | Qty: 30 | Fill #2

## 2018-11-03 DIAGNOSIS — Z01419 Encounter for gynecological examination (general) (routine) without abnormal findings: Secondary | ICD-10-CM | POA: Diagnosis not present

## 2018-11-03 DIAGNOSIS — Z1231 Encounter for screening mammogram for malignant neoplasm of breast: Secondary | ICD-10-CM | POA: Diagnosis not present

## 2018-11-03 DIAGNOSIS — Z6841 Body Mass Index (BMI) 40.0 and over, adult: Secondary | ICD-10-CM | POA: Diagnosis not present

## 2018-11-03 DIAGNOSIS — E039 Hypothyroidism, unspecified: Secondary | ICD-10-CM | POA: Diagnosis not present

## 2018-11-06 MED FILL — LEVOTHYROXINE 112 MCG TAB: 112 | 30 days supply | Qty: 30 | Fill #3

## 2018-12-14 MED FILL — OMEPRAZOLE 20 MG CPDR: 20 | 90 days supply | Qty: 90 | Fill #0

## 2018-12-14 MED FILL — LEVOTHYROXINE 112 MCG TAB: 112 | 30 days supply | Qty: 30 | Fill #4

## 2019-01-11 MED FILL — LEVOTHYROXINE 112 MCG TAB: 112 | 30 days supply | Qty: 30 | Fill #5

## 2019-01-26 DIAGNOSIS — R102 Pelvic and perineal pain: Secondary | ICD-10-CM | POA: Diagnosis not present

## 2019-02-09 MED FILL — LEVOTHYROXINE 112 MCG TAB: 112 | 30 days supply | Qty: 30 | Fill #6

## 2019-02-15 DIAGNOSIS — J358 Other chronic diseases of tonsils and adenoids: Secondary | ICD-10-CM | POA: Diagnosis not present

## 2019-02-15 DIAGNOSIS — R070 Pain in throat: Secondary | ICD-10-CM | POA: Diagnosis not present

## 2019-02-22 DIAGNOSIS — C641 Malignant neoplasm of right kidney, except renal pelvis: Secondary | ICD-10-CM | POA: Diagnosis not present

## 2019-04-06 MED FILL — OMEPRAZOLE 20 MG CPDR: 20 | 90 days supply | Qty: 90 | Fill #0

## 2019-05-06 ENCOUNTER — Ambulatory Visit (HOSPITAL_COMMUNITY)
Admission: RE | Admit: 2019-05-06 | Discharge: 2019-05-06 | Disposition: A | Discharge status: Home / Self Care | Payer: 59 | Source: Ambulatory Visit | Attending: Urology | Admitting: Urology

## 2019-05-06 ENCOUNTER — Other Ambulatory Visit (HOSPITAL_COMMUNITY): Payer: Self-pay | Admitting: Urology

## 2019-05-06 ENCOUNTER — Other Ambulatory Visit: Payer: Self-pay

## 2019-05-06 DIAGNOSIS — C641 Malignant neoplasm of right kidney, except renal pelvis: Secondary | ICD-10-CM

## 2019-05-06 DIAGNOSIS — C649 Malignant neoplasm of unspecified kidney, except renal pelvis: Secondary | ICD-10-CM | POA: Diagnosis not present

## 2019-05-13 DIAGNOSIS — C641 Malignant neoplasm of right kidney, except renal pelvis: Secondary | ICD-10-CM | POA: Diagnosis not present

## 2019-05-13 DIAGNOSIS — M545 Low back pain: Secondary | ICD-10-CM | POA: Diagnosis not present

## 2019-05-19 DIAGNOSIS — Z0000 Encounter for general adult medical examination without abnormal findings: Secondary | ICD-10-CM | POA: Diagnosis not present

## 2019-05-19 DIAGNOSIS — E039 Hypothyroidism, unspecified: Secondary | ICD-10-CM | POA: Diagnosis not present

## 2019-05-19 MED FILL — NYSTATIN 100000 UNIT/GM POW: 100000 | 20 days supply | Qty: 60 | Fill #0

## 2019-05-19 MED FILL — PROCTOZONE-HC 2.5 % CREA: 2.5 | 10 days supply | Qty: 30 | Fill #0

## 2019-05-30 IMAGING — CT CT ABD-PELV W/ CM
2 of 5 series · 16 of 46 positions shown, 18 images · IV contrast (ISOVUE)
Comparison: None.

CLINICAL DATA: Abdominal distention. Bloody diarrhea since 3 a.m.
patient had similar symptoms in the past and was diagnosed with
diverticulosis per report.

EXAM:
CT ABDOMEN AND PELVIS WITH CONTRAST
TECHNIQUE: Multidetector CT imaging of the abdomen and pelvis was performed
using the standard protocol following bolus administration of
intravenous contrast.
CONTRAST:  100 cc Ysovue-LZZ intravenous

[Series 2: axial st · axial · 0.81mm/px · z∈[+1190,+1580]mm · 13 of 90 slices shown, 15 images]
[im 6/90  soft-tissue]
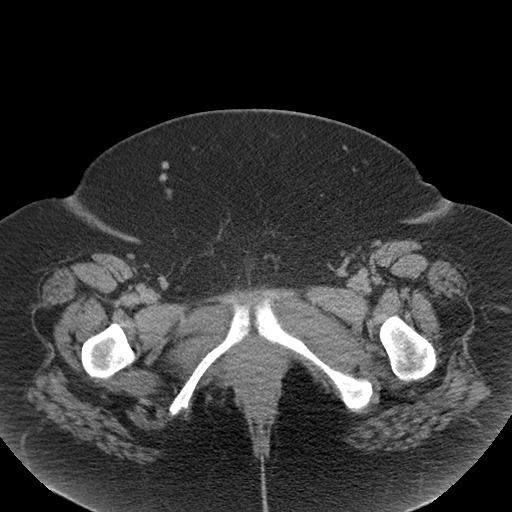
[im 6/90  bone]
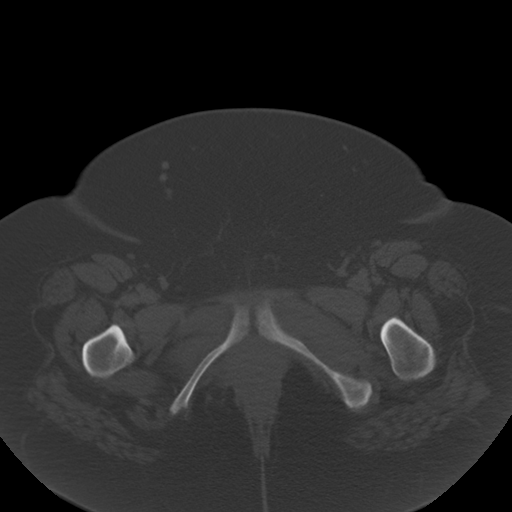
[im 11/90  soft-tissue]
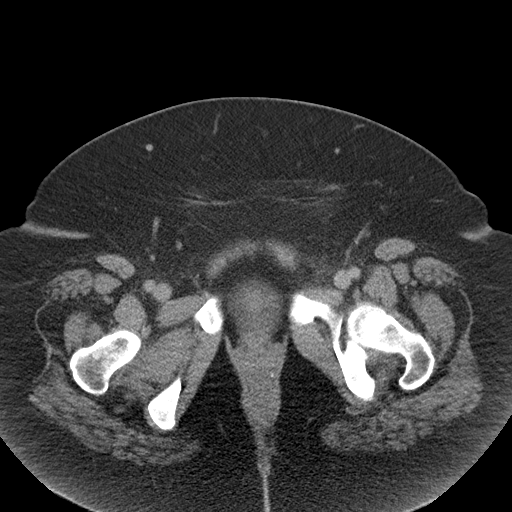
[im 21/90  soft-tissue]
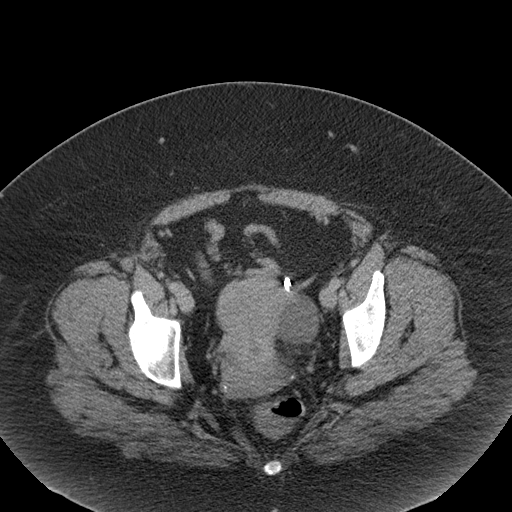
[im 27/90  soft-tissue]
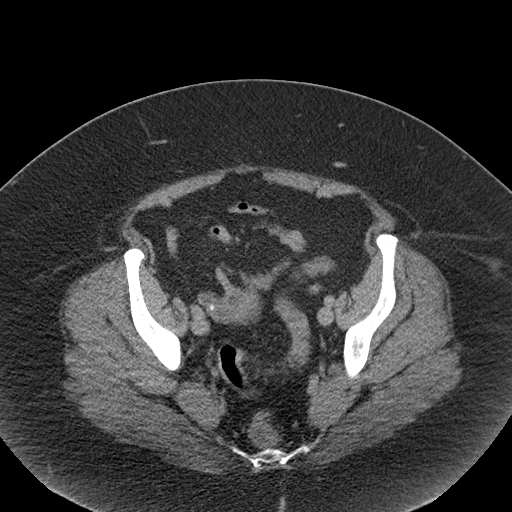
[im 32/90  soft-tissue]
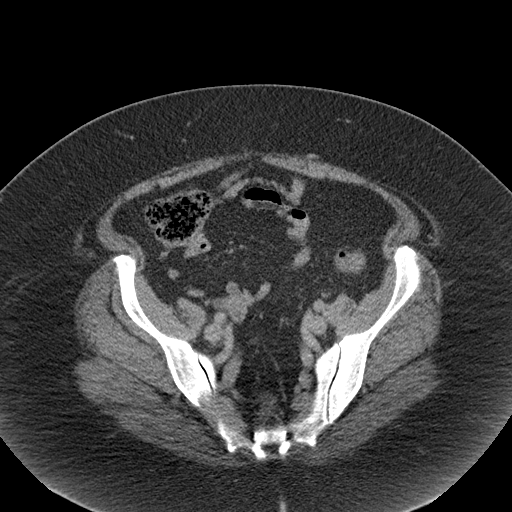
[im 37/90  soft-tissue]
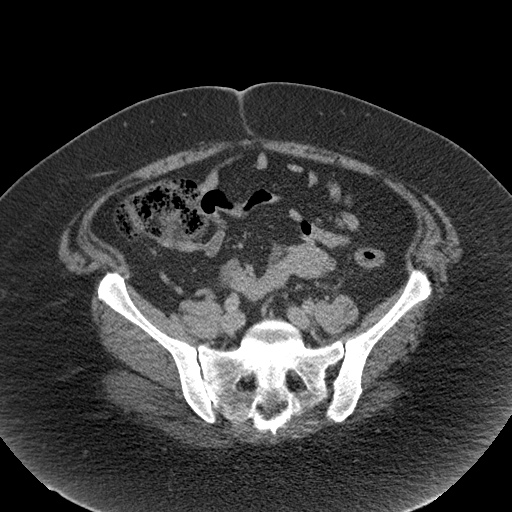
[im 48/90  soft-tissue]
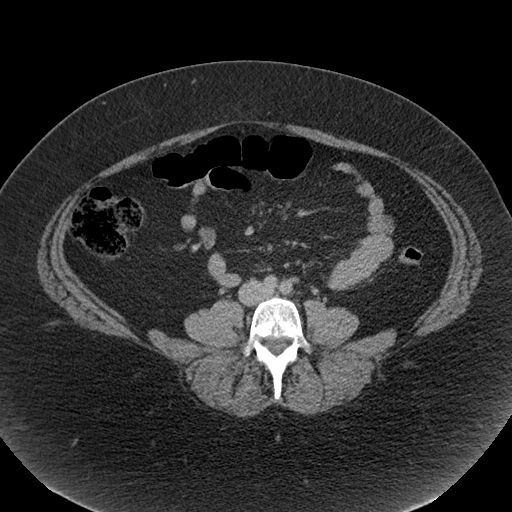
[im 53/90  soft-tissue]
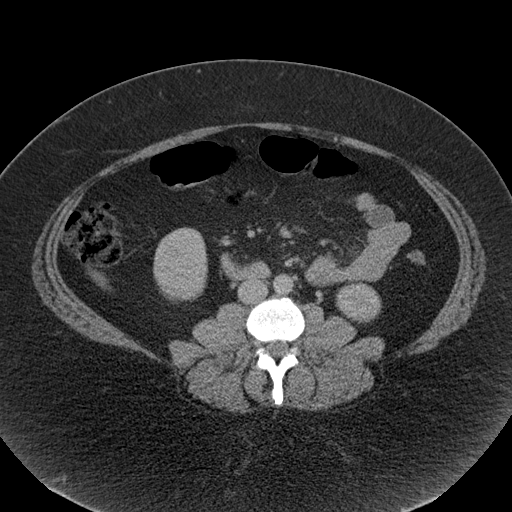
[im 58/90  soft-tissue]
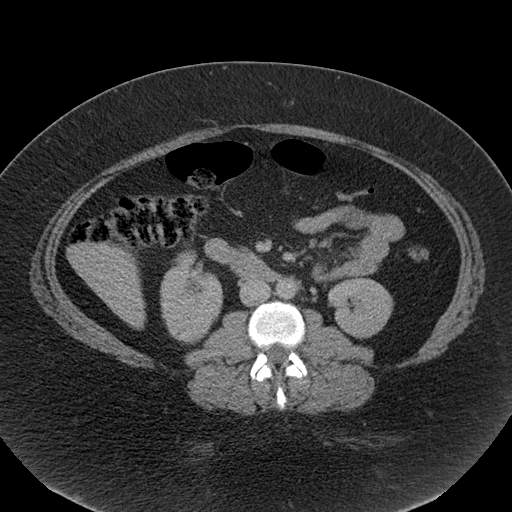
[im 58/90  bone]
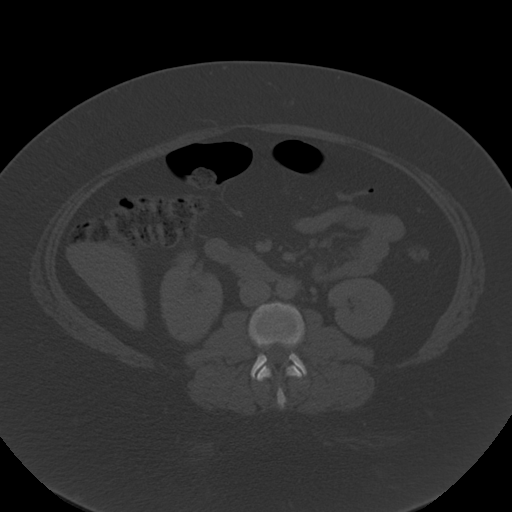
[im 63/90  soft-tissue]
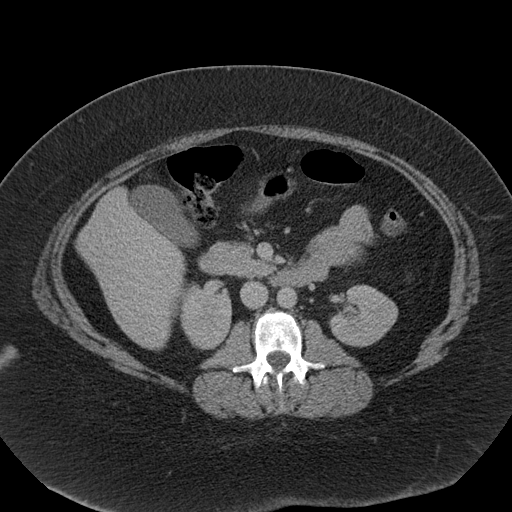
[im 69/90  soft-tissue]
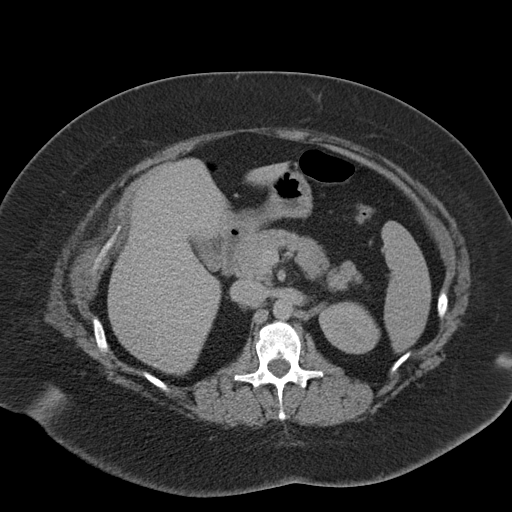
[im 79/90  soft-tissue]
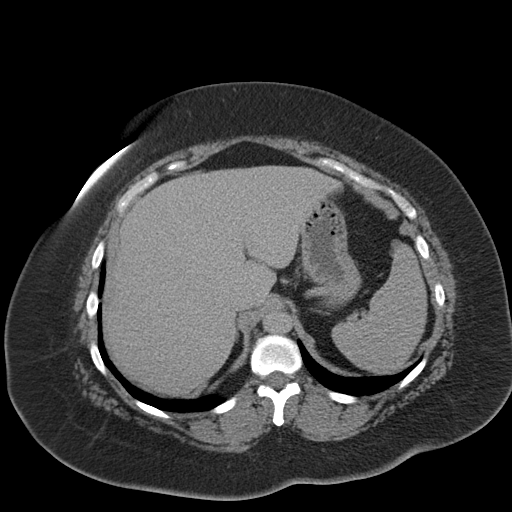
[im 84/90  soft-tissue]
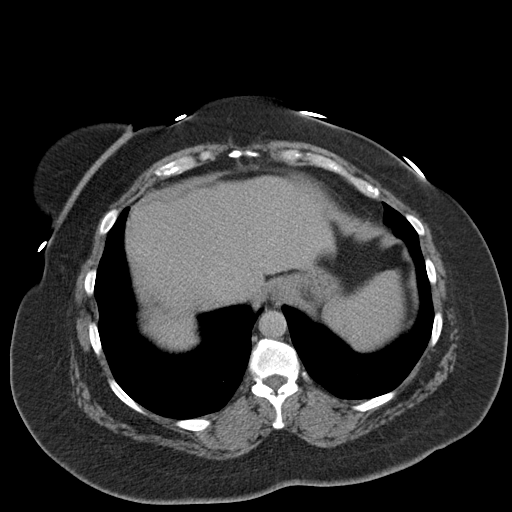

[Series 4: coronal st · coronal · 0.91mm/px · 3 of 101 slices shown]
[im 34/101  soft-tissue]
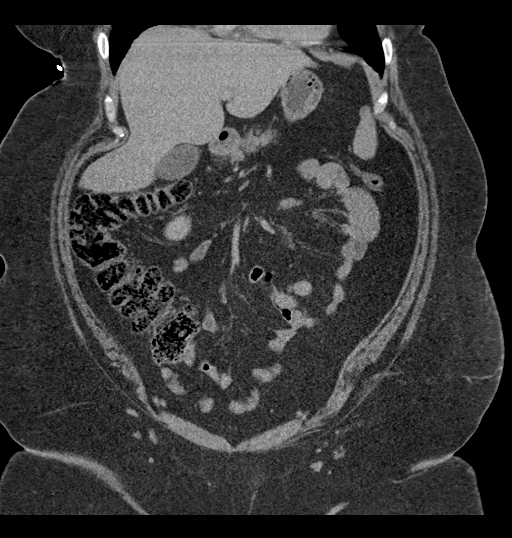
[im 45/101  soft-tissue]
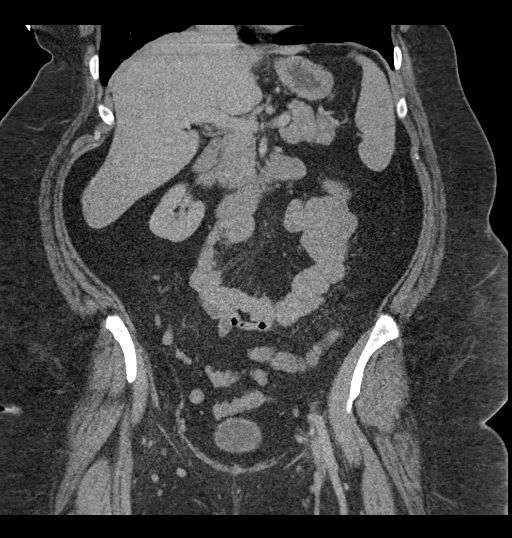
[im 56/101  soft-tissue]
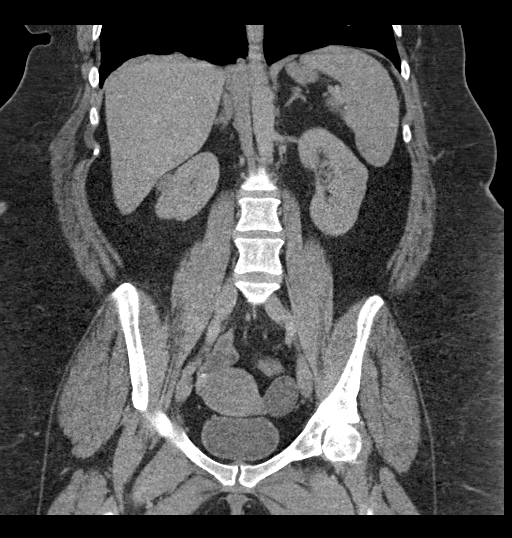

[16 of 46 positions shown; findings below may reference images not displayed]

FINDINGS: Lower chest:  No contributory findings.

Hepatobiliary: No focal liver abnormality.No evidence of biliary
obstruction or stone.

Pancreas: Unremarkable.

Spleen: 8 mm low-density exophytic from the anterior spleen that is
too small for densitometry and likely incidental in isolation.

Adrenals/Urinary Tract: Negative adrenals. 2.5 cm right renal mass
with soft tissue densitometry. There may be mild de enhancement on
the delayed phase. No hydronephrosis. Unremarkable bladder.

Stomach/Bowel:  No obstruction. No appendicitis.

Vascular/Lymphatic: No acute vascular abnormality. No mass or
adenopathy.

Reproductive:There is a 4 cm simple appearing cystic density from
the left ovary. Unexpected uterine shape, possible sub septate
uterus. The patient had a pregnancy in 3719 and there are tubal
ligation clips. Left uterine horn is thicker than the right, and
measures up to 11 mm.

Other: No ascites or pneumoperitoneum.

Musculoskeletal: No acute abnormalities.
IMPRESSION: 1. 2.5 cm right renal mass. Recommend nonemergent renal MRI with
contrast to differentiate complex cyst and solid neoplasm.
2. No acute finding. No visible bowel inflammation or
diverticulosis.
3. 4 cm simple appearing left ovarian cyst.
4. Sub septate appearance of the uterus with asymmetric thicker left
uterine horn. Has there been endometrial ablation? Gynecology
referral and/or ultrasound could be obtained if clinically needed.

## 2019-06-21 MED FILL — OMEPRAZOLE 20 MG CPDR: 20 | 30 days supply | Qty: 30 | Fill #0

## 2019-06-29 MED FILL — LEVOTHYROXINE 112 MCG TAB: 112 | 30 days supply | Qty: 30 | Fill #0

## 2019-07-26 MED FILL — OMEPRAZOLE 20 MG CAP: 20 | 30 days supply | Qty: 30 | Fill #1

## 2019-08-04 MED FILL — LEVOTHYROXINE 112 MCG TAB: 112 | 30 days supply | Qty: 30 | Fill #0

## 2019-08-09 IMAGING — DX DG CHEST 2V
2 series · 2 of 2 positions shown · non-contrast
Comparison: Chest radiograph dated 03/19/2018

CLINICAL DATA: 41-year-old female with shortness of breath

EXAM:
CHEST - 2 VIEW

[chest pa]
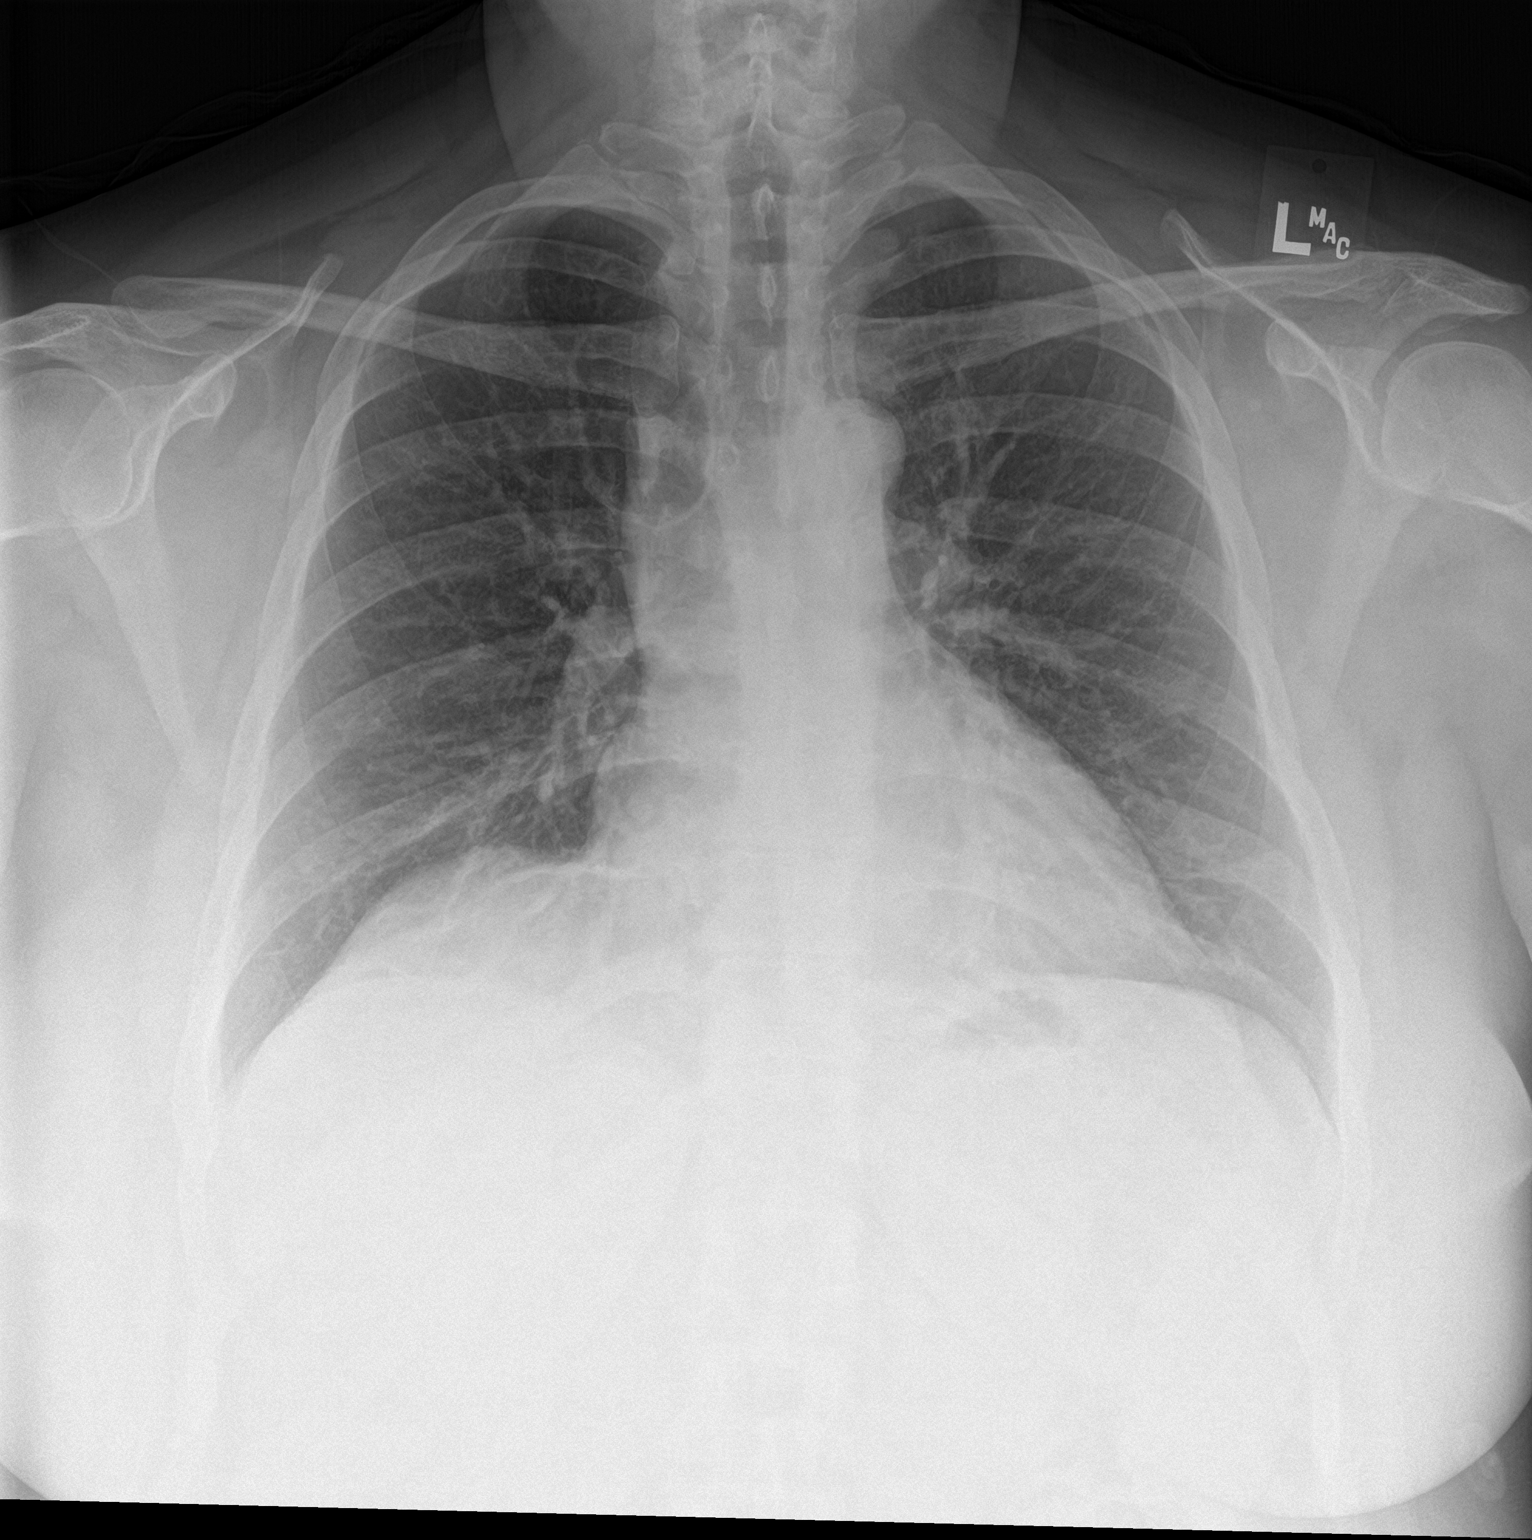

[chest lat]
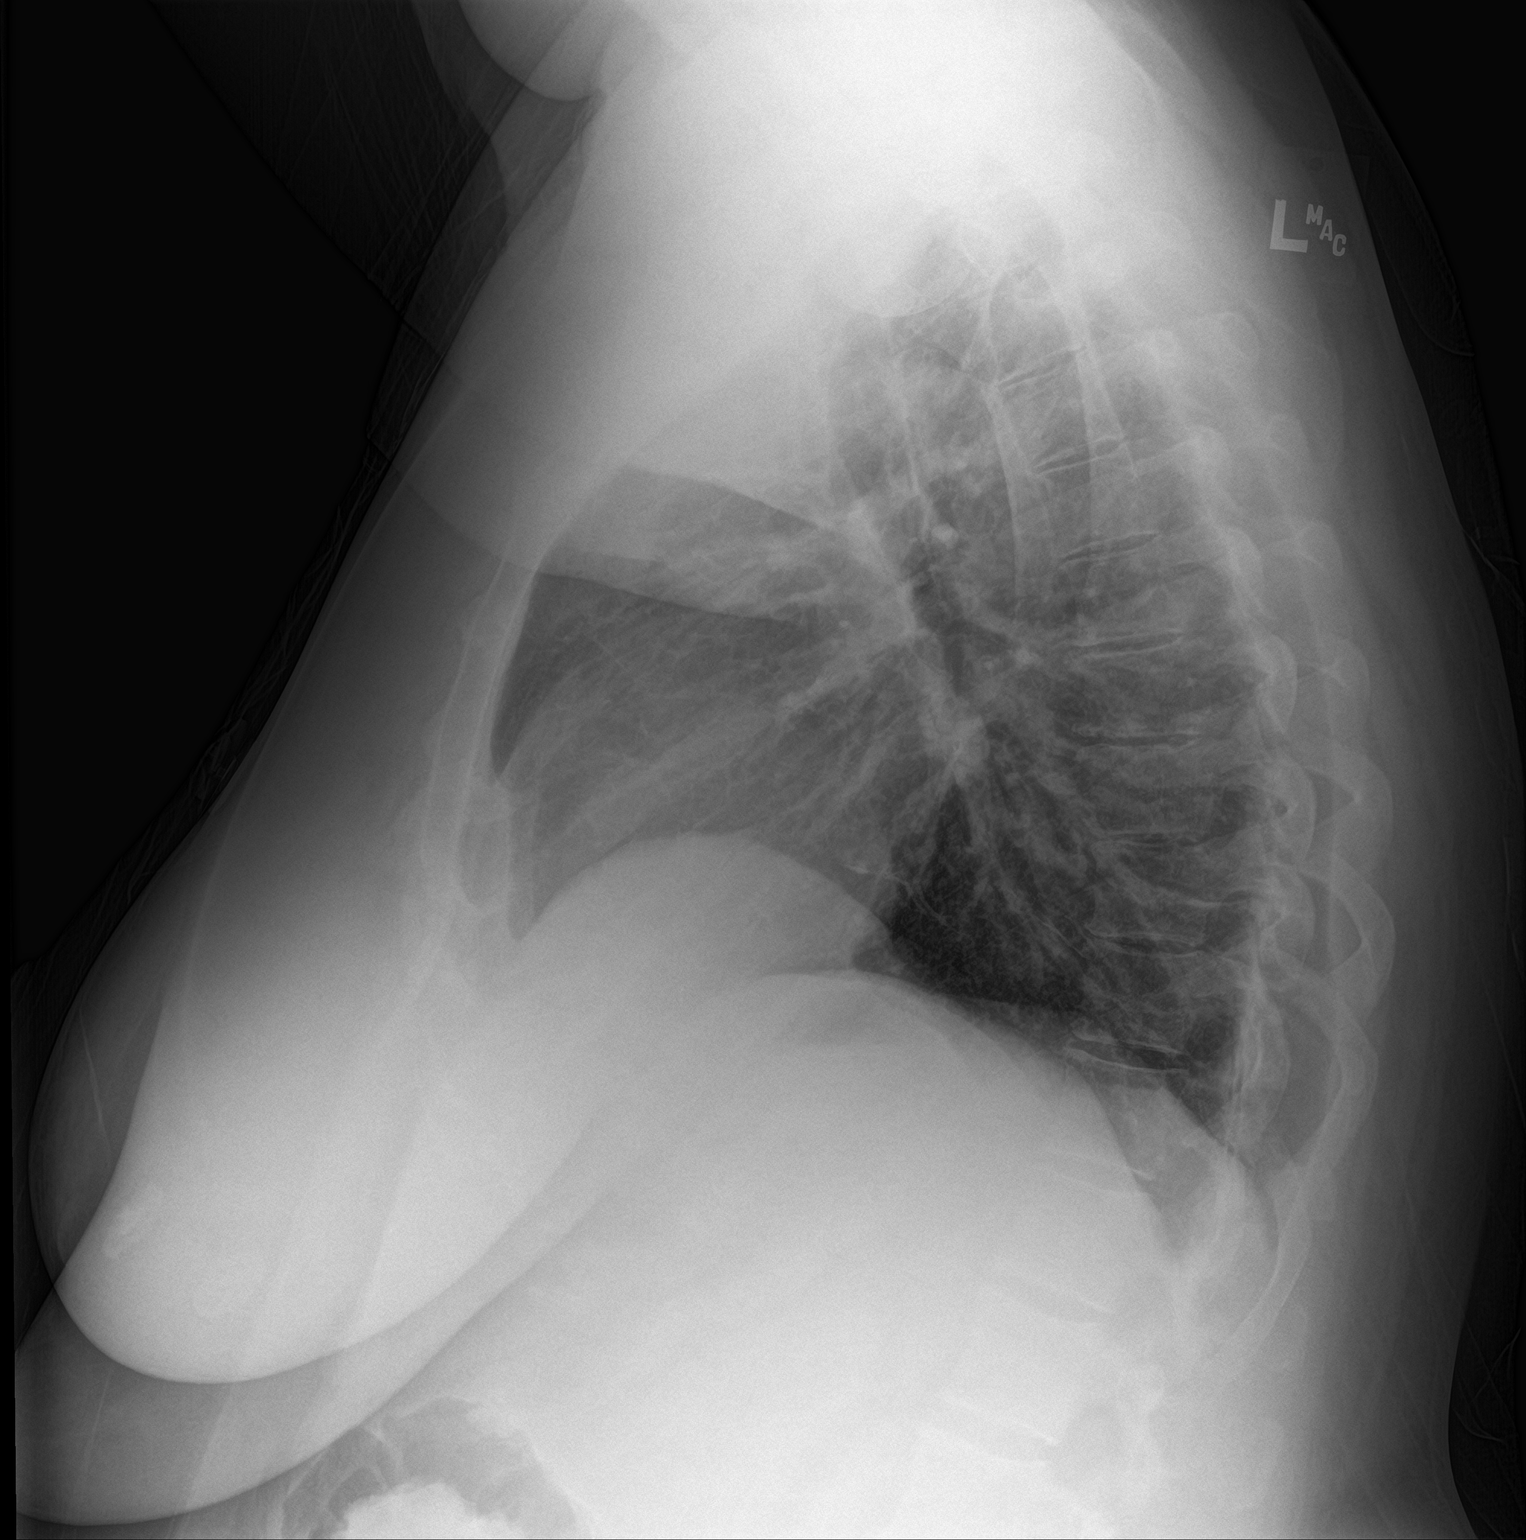

[2 of 2 positions shown; findings below may reference images not displayed]

FINDINGS: The heart size and mediastinal contours are within normal limits.
Both lungs are clear. The visualized skeletal structures are
unremarkable.
IMPRESSION: No active cardiopulmonary disease.

## 2019-08-19 MED FILL — OMEPRAZOLE 20 MG CAP: 20 | 30 days supply | Qty: 30 | Fill #2

## 2019-08-31 MED FILL — LEVOTHYROXINE 112 MCG TAB: 112 | 30 days supply | Qty: 30 | Fill #1

## 2019-09-13 MED FILL — OMEPRAZOLE 20 MG CAP: 20 | 30 days supply | Qty: 30 | Fill #3

## 2019-09-15 MED FILL — CIPROFLOXACIN HCL 500 MG TA: 500 | 5 days supply | Qty: 10 | Fill #0

## 2019-09-16 DIAGNOSIS — R300 Dysuria: Secondary | ICD-10-CM | POA: Diagnosis not present

## 2019-09-16 DIAGNOSIS — R8271 Bacteriuria: Secondary | ICD-10-CM | POA: Diagnosis not present

## 2019-10-04 MED FILL — PROPRANOLOL 10 MG TABLET: 10 | 30 days supply | Qty: 60 | Fill #0

## 2019-10-04 MED FILL — LEVOTHYROXINE 112 MCG TAB: 112 | 30 days supply | Qty: 30 | Fill #2

## 2019-10-07 MED FILL — OMEPRAZOLE 20 MG CAP: 20 | 30 days supply | Qty: 30 | Fill #4

## 2019-10-12 DIAGNOSIS — Z23 Encounter for immunization: Secondary | ICD-10-CM | POA: Diagnosis not present

## 2019-10-28 DIAGNOSIS — Z1231 Encounter for screening mammogram for malignant neoplasm of breast: Secondary | ICD-10-CM | POA: Diagnosis not present

## 2019-10-28 DIAGNOSIS — Z6841 Body Mass Index (BMI) 40.0 and over, adult: Secondary | ICD-10-CM | POA: Diagnosis not present

## 2019-10-28 DIAGNOSIS — Z01419 Encounter for gynecological examination (general) (routine) without abnormal findings: Secondary | ICD-10-CM | POA: Diagnosis not present

## 2019-11-01 MED FILL — LEVOTHYROXINE 112 MCG TAB: 112 | 30 days supply | Qty: 30 | Fill #3

## 2019-11-01 MED FILL — OMEPRAZOLE 20 MG CAP: 20 | 30 days supply | Qty: 30 | Fill #5

## 2019-11-29 MED FILL — OMEPRAZOLE 20 MG CAP: 20 | 30 days supply | Qty: 30 | Fill #0

## 2019-11-29 MED FILL — LEVOTHYROXINE 112 MCG TAB: 112 | 30 days supply | Qty: 30 | Fill #4

## 2019-12-13 MED FILL — PROPRANOLOL 10 MG TABLET: 10 | 30 days supply | Qty: 60 | Fill #1

## 2019-12-27 MED FILL — OMEPRAZOLE 20 MG CAP: 20 | 30 days supply | Qty: 30 | Fill #1

## 2019-12-27 MED FILL — LEVOTHYROXINE SODIUM 112 MC: 112 | 30 days supply | Qty: 30 | Fill #5

## 2020-01-24 MED FILL — LEVOTHYROXINE SODIUM 112 MC: 112 | 30 days supply | Qty: 30 | Fill #6

## 2020-01-24 MED FILL — OMEPRAZOLE 20 MG CAP: 20 | 30 days supply | Qty: 30 | Fill #0

## 2020-01-24 MED FILL — PROPRANOLOL 10 MG TABLET: 10 | 30 days supply | Qty: 60 | Fill #0

## 2020-02-20 ENCOUNTER — Ambulatory Visit: Payer: 59 | Attending: Internal Medicine

## 2020-02-20 DIAGNOSIS — Z23 Encounter for immunization: Secondary | ICD-10-CM | POA: Insufficient documentation

## 2020-02-20 NOTE — Progress Notes (Signed)
   Covid-19 Vaccination Clinic  Name:  Kathleen Henry    MRN: [id number] DOB: 1976-06-25  02/20/2020  Ms. Kathleen Henry was observed post Covid-19 immunization for 15 minutes without incident. She was provided with Vaccine Information Sheet and instruction to access the V-Safe system.   Ms. Kathleen Henry was instructed to call 911 with any severe reactions post vaccine: Marland Kitchen Difficulty breathing  . Swelling of face and throat  . A fast heartbeat  . A bad rash all over body  . Dizziness and weakness   Immunizations Administered    Name Date Dose VIS Date Route   Pfizer COVID-19 Vaccine 02/20/2020  3:34 PM 0.3 mL 11/26/2019 Intramuscular   Manufacturer: [place]   Lot: [id number]   [place]: [id number]

## 2020-02-28 MED FILL — LEVOTHYROXINE SODIUM 112 MC: 112 | 30 days supply | Qty: 30 | Fill #7

## 2020-02-28 MED FILL — OMEPRAZOLE 20 MG CAP: 20 | 30 days supply | Qty: 30 | Fill #0

## 2020-02-28 MED FILL — PROPRANOLOL 10 MG TABLET: 10 | 30 days supply | Qty: 60 | Fill #0

## 2020-03-12 ENCOUNTER — Ambulatory Visit: Payer: 59

## 2020-03-13 DIAGNOSIS — R253 Fasciculation: Secondary | ICD-10-CM | POA: Diagnosis not present

## 2020-03-13 DIAGNOSIS — Z8781 Personal history of (healed) traumatic fracture: Secondary | ICD-10-CM | POA: Diagnosis not present

## 2020-03-14 ENCOUNTER — Ambulatory Visit: Payer: 59 | Attending: Internal Medicine

## 2020-03-14 DIAGNOSIS — Z23 Encounter for immunization: Secondary | ICD-10-CM

## 2020-03-14 NOTE — Progress Notes (Signed)
   Covid-19 Vaccination Clinic  Name:  Kathleen Henry    MRN: [id number] DOB: [birth year]  03/14/2020  Kathleen Henry was observed post Covid-19 immunization for 15 minutes without incident. She was provided with Vaccine Information Sheet and instruction to access the V-Safe system.   Kathleen Henry was instructed to call 911 with any severe reactions post vaccine: Marland Kitchen Difficulty breathing  . Swelling of face and throat  . A fast heartbeat  . A bad rash all over body  . Dizziness and weakness   Immunizations Administered    Name Date Dose VIS Date Route   Pfizer COVID-19 Vaccine 03/14/2020  3:43 PM 0.3 mL 11/26/2019 Intramuscular   Manufacturer: [place]   Lot: [phone] [id number]   [place]: [id number]

## 2020-03-27 MED FILL — OMEPRAZOLE 20 MG CAP: 20 | 30 days supply | Qty: 30 | Fill #0

## 2020-03-27 MED FILL — LEVOTHYROXINE SODIUM 112 MC: 112 | 30 days supply | Qty: 30 | Fill #0

## 2020-05-02 DIAGNOSIS — C641 Malignant neoplasm of right kidney, except renal pelvis: Secondary | ICD-10-CM | POA: Diagnosis not present

## 2020-05-05 ENCOUNTER — Other Ambulatory Visit: Payer: Self-pay

## 2020-05-05 ENCOUNTER — Ambulatory Visit (HOSPITAL_COMMUNITY)
Admission: RE | Admit: 2020-05-05 | Discharge: 2020-05-05 | Disposition: A | Discharge status: Home / Self Care | Payer: 59 | Source: Ambulatory Visit | Attending: Family Medicine | Admitting: Family Medicine

## 2020-05-05 ENCOUNTER — Other Ambulatory Visit (HOSPITAL_COMMUNITY): Payer: Self-pay | Admitting: Family Medicine

## 2020-05-05 DIAGNOSIS — C641 Malignant neoplasm of right kidney, except renal pelvis: Secondary | ICD-10-CM | POA: Diagnosis not present

## 2020-05-05 DIAGNOSIS — C649 Malignant neoplasm of unspecified kidney, except renal pelvis: Secondary | ICD-10-CM | POA: Diagnosis not present

## 2020-05-11 DIAGNOSIS — C641 Malignant neoplasm of right kidney, except renal pelvis: Secondary | ICD-10-CM | POA: Diagnosis not present

## 2020-05-11 DIAGNOSIS — R350 Frequency of micturition: Secondary | ICD-10-CM | POA: Diagnosis not present

## 2020-05-19 MED FILL — PROPRANOLOL 10 MG TABLET: 10 | 30 days supply | Qty: 60 | Fill #0

## 2020-05-19 MED FILL — LEVOTHYROXINE SODIUM 112 MC: 112 | 30 days supply | Qty: 30 | Fill #2

## 2020-05-19 MED FILL — OMEPRAZOLE 20 MG CAP: 20 | 30 days supply | Qty: 30 | Fill #0

## 2020-06-26 MED FILL — OMEPRAZOLE 20 MG CAP: 20 | 30 days supply | Qty: 30 | Fill #0

## 2020-06-26 MED FILL — LEVOTHYROXINE SODIUM 112 MC: 112 | 30 days supply | Qty: 30 | Fill #0

## 2020-06-26 MED FILL — PROPRANOLOL 10 MG TABLET: 10 | 30 days supply | Qty: 60 | Fill #0

## 2020-07-24 MED FILL — PROPRANOLOL 10 MG TABLET: 10 | 30 days supply | Qty: 60 | Fill #0

## 2020-07-24 MED FILL — LEVOTHYROXINE SODIUM 112 MC: 112 | 30 days supply | Qty: 30 | Fill #1

## 2020-07-24 MED FILL — OMEPRAZOLE 20 MG CAP: 20 | 30 days supply | Qty: 30 | Fill #0

## 2020-08-24 MED FILL — LEVOTHYROXINE SODIUM 112 MC: 112 | 30 days supply | Qty: 30 | Fill #2

## 2020-08-24 MED FILL — PROPRANOLOL 10 MG TABLET: 10 | 30 days supply | Qty: 60 | Fill #0

## 2020-08-24 MED FILL — OMEPRAZOLE 20 MG CAP: 20 | 30 days supply | Qty: 30 | Fill #0

## 2020-09-13 DIAGNOSIS — Z23 Encounter for immunization: Secondary | ICD-10-CM | POA: Diagnosis not present

## 2020-09-25 ENCOUNTER — Other Ambulatory Visit (HOSPITAL_COMMUNITY): Payer: Self-pay | Admitting: Family Medicine

## 2020-09-25 MED FILL — OMEPRAZOLE 20 MG CAP: 20 | 30 days supply | Qty: 30 | Fill #0

## 2020-09-25 MED FILL — PROPRANOLOL 10 MG TABLET: 10 | 30 days supply | Qty: 60 | Fill #0

## 2020-09-25 MED FILL — LEVOTHYROXINE SODIUM 112 MC: 112 | 30 days supply | Qty: 30 | Fill #0

## 2020-10-16 DIAGNOSIS — G8929 Other chronic pain: Secondary | ICD-10-CM | POA: Diagnosis not present

## 2020-10-16 DIAGNOSIS — M79662 Pain in left lower leg: Secondary | ICD-10-CM | POA: Diagnosis not present

## 2020-10-16 DIAGNOSIS — M25552 Pain in left hip: Secondary | ICD-10-CM | POA: Diagnosis not present

## 2020-10-17 DIAGNOSIS — M79662 Pain in left lower leg: Secondary | ICD-10-CM | POA: Diagnosis not present

## 2020-10-23 MED FILL — LEVOTHYROXINE SODIUM 112 MC: 112 | 30 days supply | Qty: 30 | Fill #1

## 2020-10-23 MED FILL — OMEPRAZOLE 20 MG CAP: 20 | 30 days supply | Qty: 30 | Fill #0

## 2020-10-23 MED FILL — PROPRANOLOL 10 MG TABLET: 10 | 30 days supply | Qty: 60 | Fill #0

## 2020-11-13 ENCOUNTER — Other Ambulatory Visit (HOSPITAL_COMMUNITY): Payer: Self-pay | Admitting: Family Medicine

## 2020-11-16 DIAGNOSIS — N643 Galactorrhea not associated with childbirth: Secondary | ICD-10-CM | POA: Diagnosis not present

## 2020-11-16 DIAGNOSIS — Z01419 Encounter for gynecological examination (general) (routine) without abnormal findings: Secondary | ICD-10-CM | POA: Diagnosis not present

## 2020-11-16 DIAGNOSIS — Z6841 Body Mass Index (BMI) 40.0 and over, adult: Secondary | ICD-10-CM | POA: Diagnosis not present

## 2020-11-16 MED FILL — LEVOTHYROXINE SODIUM 112 MC: 112 | 30 days supply | Qty: 30 | Fill #2

## 2020-11-17 MED FILL — PROPRANOLOL 10 MG TABLET: 10 | 30 days supply | Qty: 60 | Fill #0

## 2020-11-17 MED FILL — OMEPRAZOLE 20 MG CAP: 20 | 30 days supply | Qty: 30 | Fill #0

## 2020-12-11 DIAGNOSIS — Z1231 Encounter for screening mammogram for malignant neoplasm of breast: Secondary | ICD-10-CM | POA: Diagnosis not present

## 2020-12-25 ENCOUNTER — Other Ambulatory Visit (HOSPITAL_COMMUNITY): Payer: Self-pay | Admitting: Family Medicine

## 2020-12-25 MED FILL — PROPRANOLOL 10 MG TABLET: 10 | 30 days supply | Qty: 60 | Fill #0

## 2020-12-25 MED FILL — OMEPRAZOLE 20 MG CAP: 20 | 30 days supply | Qty: 30 | Fill #0

## 2020-12-25 MED FILL — LEVOTHYROXINE SODIUM 112 MC: 112 | 30 days supply | Qty: 30 | Fill #0

## 2020-12-28 ENCOUNTER — Other Ambulatory Visit (HOSPITAL_COMMUNITY): Payer: Self-pay | Admitting: Nurse Practitioner

## 2020-12-30 MED FILL — ALBUTEROL SULFATE HFA 108 (: 108 (90 BAS | 17 days supply | Qty: 18 | Fill #0

## 2020-12-30 MED FILL — BENZONATATE 100 MG CAPS: 100 | 10 days supply | Qty: 30 | Fill #0

## 2020-12-30 MED FILL — METHYLPREDNISOLONE 4 MG TBP: 4 | 6 days supply | Qty: 21 | Fill #0

## 2021-01-09 ENCOUNTER — Other Ambulatory Visit (HOSPITAL_BASED_OUTPATIENT_CLINIC_OR_DEPARTMENT_OTHER): Payer: Self-pay | Admitting: Family Medicine

## 2021-01-09 MED FILL — busPIRone HCL 5 MG TABS: 5 | 30 days supply | Qty: 90 | Fill #0

## 2021-01-09 MED FILL — LEVOTHYROXINE SODIUM 125 MC: 125 | 30 days supply | Qty: 30 | Fill #0

## 2021-01-12 ENCOUNTER — Other Ambulatory Visit (HOSPITAL_BASED_OUTPATIENT_CLINIC_OR_DEPARTMENT_OTHER): Payer: Self-pay | Admitting: Nurse Practitioner

## 2021-01-12 MED FILL — ADVAIR 100/50 DISKUS: 100-50 | 30 days supply | Qty: 60 | Fill #0

## 2021-01-12 MED FILL — AZELASTINE HCL 137 MCG/SPRA: 137 | 30 days supply | Qty: 30 | Fill #0

## 2021-01-29 ENCOUNTER — Other Ambulatory Visit: Payer: Self-pay

## 2021-01-29 ENCOUNTER — Encounter (HOSPITAL_BASED_OUTPATIENT_CLINIC_OR_DEPARTMENT_OTHER): Payer: Self-pay | Admitting: Emergency Medicine

## 2021-01-29 DIAGNOSIS — Z85528 Personal history of other malignant neoplasm of kidney: Secondary | ICD-10-CM | POA: Insufficient documentation

## 2021-01-29 DIAGNOSIS — E039 Hypothyroidism, unspecified: Secondary | ICD-10-CM | POA: Diagnosis not present

## 2021-01-29 DIAGNOSIS — R002 Palpitations: Secondary | ICD-10-CM | POA: Diagnosis present

## 2021-01-29 DIAGNOSIS — Z79899 Other long term (current) drug therapy: Secondary | ICD-10-CM | POA: Insufficient documentation

## 2021-01-29 DIAGNOSIS — R000 Tachycardia, unspecified: Secondary | ICD-10-CM | POA: Insufficient documentation

## 2021-01-29 NOTE — ED Triage Notes (Signed)
Pt states her heart has been beating irregular tonight   Pt states a couple weeks ago she had the same  Was seen by her PCP and they changed some of her medications  Pt states she can be sitting there and its like an adrenaline rush and her heart rate shoots up and then comes back down and then back up again

## 2021-01-30 ENCOUNTER — Emergency Department (HOSPITAL_BASED_OUTPATIENT_CLINIC_OR_DEPARTMENT_OTHER)
Admission: EM | Admit: 2021-01-30 | Discharge: 2021-01-30 | Disposition: A | Discharge status: Home / Self Care | Payer: 59 | Attending: Emergency Medicine | Admitting: Emergency Medicine

## 2021-01-30 DIAGNOSIS — R000 Tachycardia, unspecified: Secondary | ICD-10-CM

## 2021-01-30 LAB — COMPREHENSIVE METABOLIC PANEL
ALT: 24 U/L (ref 0–44)
AST: 20 U/L (ref 15–41)
Albumin: 3.6 g/dL (ref 3.5–5.0)
Alkaline Phosphatase: 64 U/L (ref 38–126)
Anion gap: 10 (ref 5–15)
BUN: 16 mg/dL (ref 6–20)
CO2: 25 mmol/L (ref 22–32)
Calcium: 8.8 mg/dL — ABNORMAL LOW (ref 8.9–10.3)
Chloride: 104 mmol/L (ref 98–111)
Creatinine, Ser: 1.05 mg/dL — ABNORMAL HIGH (ref 0.44–1.00)
GFR, Estimated: >60 mL/min (ref >60)
Glucose, Bld: 136 mg/dL — ABNORMAL HIGH (ref 70–99)
Potassium: 3.4 mmol/L — ABNORMAL LOW (ref 3.5–5.1)
Sodium: 139 mmol/L (ref 135–145)
Total Bilirubin: 0.4 mg/dL (ref 0.3–1.2)
Total Protein: 7.1 g/dL (ref 6.5–8.1)

## 2021-01-30 LAB — CBC WITH DIFFERENTIAL/PLATELET
Abs Immature Granulocytes: 0.05 10*3/uL (ref 0.00–0.07)
Basophils Absolute: 0 10*3/uL (ref 0.0–0.1)
Basophils Relative: 1 %
Eosinophils Absolute: 0.1 10*3/uL (ref 0.0–0.5)
Eosinophils Relative: 2 %
HCT: 39.7 % (ref 36.0–46.0)
Hemoglobin: 13.3 g/dL (ref 12.0–15.0)
Immature Granulocytes: 1 %
Lymphocytes Relative: 33 %
Lymphs Abs: 2.8 10*3/uL (ref 0.7–4.0)
MCH: 28.9 pg (ref 26.0–34.0)
MCHC: 33.5 g/dL (ref 30.0–36.0)
MCV: 86.1 fL (ref 80.0–100.0)
Monocytes Absolute: 0.6 10*3/uL (ref 0.1–1.0)
Monocytes Relative: 7 %
Neutro Abs: 4.9 10*3/uL (ref 1.7–7.7)
Neutrophils Relative %: 56 %
Platelets: 263 10*3/uL (ref 150–400)
RBC: 4.61 MIL/uL (ref 3.87–5.11)
RDW: 13.1 % (ref 11.5–15.5)
WBC: 8.5 10*3/uL (ref 4.0–10.5)
nRBC: 0 % (ref 0.0–0.2)

## 2021-01-30 LAB — TROPONIN I (HIGH SENSITIVITY): Troponin I (High Sensitivity): 2 ng/L (ref <18)

## 2021-01-30 NOTE — ED Provider Notes (Signed)
[facility] Provider Note: Georgena Spurling, MD, FACEP  CSN: [id number] MRN: [id number] ARRIVAL: 01/29/21 at 2336 ROOM: [place]  Palpitations   HISTORY OF PRESENT ILLNESS  01/30/21 1:08 AM Kathleen Henry is a 45 y.o. female on Synthroid for hypothyroidism.  Her dose was increased to 125 mcg daily last month when her TSH was more elevated than baseline at 3.72.  She also takes Inderal for sinus tachycardia which could be episodic.  She had been taking 5 mg of Inderal 4 times daily but sometimes this causes her to become bradycardic so she took only 1 dose of Inderal yesterday.  Yesterday evening she had what she describes as a wave of adrenaline come up her body associated with palpitations (rapid heart rate).  Her heart rate was as high as 148 on her wristwatch monitor.  She took 5 mg of Inderal but her tachycardia persisted.  On the monitor here she is had a heart rate persistently in the 90s to 100s.  She has had no chest pain or shortness of breath with it.   Past Medical History:  Diagnosis Date  . AMA (advanced maternal age) multigravida 19+   . Anemia   . Benign cyst of skin 2014   To neck- removed  . Cancer ([place])    kidney cancer, clear cell  . Family history of colon cancer   . Family history of leukemia   . GERD (gastroesophageal reflux disease)   . History of PCOS   . Hypothyroidism   . Palpitations    occasional   . Personal history of renal cell carcinoma 05/19/2018  . PONV (postoperative nausea and vomiting)    with epidurals had " shakes" afterwards   . Sinus tachycardia   . Yeast infection    hx of in creases of legs per patient, most recent 04/10/2018 patient had to take diflucan x 1 and nystatin cream with clearing.     Past Surgical History:  Procedure Laterality Date  . CESAREAN SECTION    . COLONOSCOPY    . CYSTOSCOPY WITH FULGERATION N/A 04/30/2018   Procedure: CYSTOSCOPY WITH EVACUATION;  Surgeon: Kathie Rhodes, MD;   Location: [facility];  Service: Urology;  Laterality: N/A;  . DILATION AND CURETTAGE OF UTERUS  12/05/2016  . ENDOMETRIAL ABLATION  12/05/2016  . RENAL MASS EXCISION    . ROBOTIC ASSITED PARTIAL NEPHRECTOMY Right 04/17/2018   Procedure: XI ROBOTIC ASSITED PARTIAL NEPHRECTOMY;  Surgeon: Ceasar Mons, MD;  Location: [facility];  Service: Urology;  Laterality: Right;  . TUBAL LIGATION  12/05/2016    Family History  Problem Relation Age of Onset  . Hypertension Mother   . Cancer Mother        colon  . CAD Father   . Hypertension Father   . Diabetes Father        Type 2  . Thyroid disease Father   . Kidney disease Father   . Diabetes Daughter        Type 1  . Cancer Paternal Grandmother        liver  . Cancer Paternal Grandfather        leaukemia    Social History   Tobacco Use  . Smoking status: Never Smoker  . Smokeless tobacco: Never Used  Vaping Use  . Vaping Use: Never used  Substance Use Topics  . Alcohol use: No  . Drug use: No    Prior to Admission medications  Medication Sig Start Date End Date Taking? Authorizing Provider  acetaminophen (TYLENOL) 500 MG tablet Take 1,000 mg by mouth every 6 (six) hours as needed for moderate pain.    [provider]  fluconazole (DIFLUCAN) 100 MG tablet Take 150 mg by mouth daily.  04/10/18   [provider]  fluticasone (FLONASE) 50 MCG/ACT nasal spray Place 1 spray into both nostrils daily.     [provider]  levothyroxine (SYNTHROID, LEVOTHROID) 125 MCG tablet Take 125 mcg by mouth daily before breakfast.    [provider]  mirabegron ER (MYRBETRIQ) 50 MG TB24 tablet Take 1 tablet (50 mg total) by mouth daily. 04/30/18   Kathie Rhodes, MD  omeprazole (PRILOSEC) 20 MG capsule Take 20 mg by mouth daily.    [provider]  oxybutynin (DITROPAN) 5 MG tablet Take 1 tablet (5 mg total) by mouth 2 (two) times daily. 04/29/18   Jola Schmidt, MD  Prenatal Vit-Fe Fumarate-FA (PRENATAL  MULTIVITAMIN) TABS tablet Take 1 tablet by mouth daily.     [provider]  Probiotic Product (PROBIOTIC DAILY PO) Take 1 capsule by mouth daily.    [provider]  VITAMIN D, CHOLECALCIFEROL, PO Take 5,000 Units by mouth daily.     [provider]  propranolol (INDERAL) 10 MG tablet Take 10 mg by mouth 2 (two) times daily as needed (heart palpations- patient states she has not taken in over a year).  05/26/14 01/30/21  [provider]    Allergies Aspirin, Ibuprofen, Lexapro [escitalopram], and Penicillins   REVIEW OF SYSTEMS  Negative except as noted here or in the History of Present Illness.   PHYSICAL EXAMINATION  Initial Vital Signs Blood pressure (!) 163/102, pulse (!) 107, temperature 97.7 F (36.5 C), temperature source Oral, resp. rate 20, height 5\' 7"  (1.702 m), weight (!) 142.9 kg, SpO2 100 %, unknown if currently breastfeeding.  Examination General: Well-developed, well-nourished female in no acute distress; appearance consistent with age of record HENT: normocephalic; atraumatic Eyes: Normal appearance Neck: supple Heart: regular rate and rhythm; tachycardia Lungs: clear to auscultation bilaterally Abdomen: soft; nondistended;bowel sounds present Extremities: No deformity; full range of motion; pulses normal Neurologic: Awake, alert and oriented; motor function intact in all extremities and symmetric; no facial droop Skin: Warm and dry Psychiatric: Normal mood and affect   RESULTS  Summary of this visit's results, reviewed and interpreted by myself:   EKG Interpretation  Date/Time:  Monday January 29 2021 23:41:40 EST Ventricular Rate:  106 PR Interval:  152 QRS Duration: 86 QT Interval:  338 QTC Calculation: 448 R Axis:   83 Text Interpretation: Sinus tachycardia Otherwise normal ECG No significant change was found Confirmed by Sherelle Castelli, Jenny Reichmann [id number]) on 01/29/2021 11:55:54 PM      Laboratory Studies: Results for orders  placed or performed during the hospital encounter of 01/30/21 (from the past 24 hour(s))  CBC with Differential     Status: None   Collection Time: 01/30/21 12:27 AM  Result Value Ref Range   WBC 8.5 4.0 - 10.5 K/uL   RBC 4.61 3.87 - 5.11 MIL/uL   Hemoglobin 13.3 12.0 - 15.0 g/dL   HCT 39.7 36.0 - 46.0 %   MCV 86.1 80.0 - 100.0 fL   MCH 28.9 26.0 - 34.0 pg   MCHC 33.5 30.0 - 36.0 g/dL   RDW 13.1 11.5 - 15.5 %   Platelets 263 150 - 400 K/uL   nRBC 0.0 0.0 - 0.2 %   Neutrophils  Relative % 56 %   Neutro Abs 4.9 1.7 - 7.7 K/uL   Lymphocytes Relative 33 %   Lymphs Abs 2.8 0.7 - 4.0 K/uL   Monocytes Relative 7 %   Monocytes Absolute 0.6 0.1 - 1.0 K/uL   Eosinophils Relative 2 %   Eosinophils Absolute 0.1 0.0 - 0.5 K/uL   Basophils Relative 1 %   Basophils Absolute 0.0 0.0 - 0.1 K/uL   Immature Granulocytes 1 %   Abs Immature Granulocytes 0.05 0.00 - 0.07 K/uL  Comprehensive metabolic panel     Status: Abnormal   Collection Time: 01/30/21 12:27 AM  Result Value Ref Range   Sodium 139 135 - 145 mmol/L   Potassium 3.4 (L) 3.5 - 5.1 mmol/L   Chloride 104 98 - 111 mmol/L   CO2 25 22 - 32 mmol/L   Glucose, Bld 136 (H) 70 - 99 mg/dL   BUN 16 6 - 20 mg/dL   Creatinine, Ser 1.05 (H) 0.44 - 1.00 mg/dL   Calcium 8.8 (L) 8.9 - 10.3 mg/dL   Total Protein 7.1 6.5 - 8.1 g/dL   Albumin 3.6 3.5 - 5.0 g/dL   AST 20 15 - 41 U/L   ALT 24 0 - 44 U/L   Alkaline Phosphatase 64 38 - 126 U/L   Total Bilirubin 0.4 0.3 - 1.2 mg/dL   GFR, Estimated >60 >60 mL/min   Anion gap 10 5 - 15  Troponin I (High Sensitivity)     Status: None   Collection Time: 01/30/21 12:27 AM  Result Value Ref Range   Troponin I (High Sensitivity) 2 <18 ng/L   Imaging Studies: No results found.  ED COURSE and MDM  Nursing notes, initial and subsequent vitals signs, including pulse oximetry, reviewed and interpreted by myself.  Vitals:   01/29/21 2343 01/29/21 2347  BP:  (!) 163/102  Pulse:  (!) 107  Resp:  20   Temp:  97.7 F (36.5 C)  TempSrc:  Oral  SpO2:  100%  Weight: (!) 142.9 kg   Height: 5\' 7"  (1.702 m)    Medications - No data to display  The patient was advised to take her Inderal regularly.  If she feels her heart rate is too low she may skip a single dose but should not completely stop taking Inderal as this can cause rebound tachycardia.  She was advised she needs to follow-up with cardiology (she is a patient at [facility] and will follow up with [facility] cardiology) for reevaluation of her sinus tachycardia and management of her propranolol dosing.  Her troponin is 2 and her EKG shows sinus tachycardia.  I do not believe she is having any cardiac ischemia.  She was advised she will need her TSH rechecked but it may be several weeks before it has adequately equilibrated to her recent dose change.  She was advised that rare condition such as pheochromocytoma or carcinoid syndrome can cause similar symptoms and these need to be evaluated by her PCP.   PROCEDURES  Procedures   ED DIAGNOSES     ICD-10-CM   1. Sinus tachycardia  R00.0        Valera Vallas, Jenny Reichmann, MD 01/30/21 [phone]

## 2021-02-01 ENCOUNTER — Other Ambulatory Visit (HOSPITAL_COMMUNITY): Payer: Self-pay | Admitting: Family Medicine

## 2021-02-01 MED FILL — OMEPRAZOLE 20 MG CAP: 20 | 30 days supply | Qty: 30 | Fill #0

## 2021-02-02 MED FILL — LEVOTHYROXINE SODIUM 125 MC: 125 | 30 days supply | Qty: 30 | Fill #0

## 2021-02-02 MED FILL — PROPRANOLOL 10 MG TABLET: 10 | 30 days supply | Qty: 120 | Fill #0

## 2021-02-28 ENCOUNTER — Other Ambulatory Visit (HOSPITAL_COMMUNITY): Payer: Self-pay | Admitting: Nurse Practitioner

## 2021-03-05 ENCOUNTER — Other Ambulatory Visit (HOSPITAL_BASED_OUTPATIENT_CLINIC_OR_DEPARTMENT_OTHER): Payer: Self-pay

## 2021-03-17 ENCOUNTER — Emergency Department (HOSPITAL_BASED_OUTPATIENT_CLINIC_OR_DEPARTMENT_OTHER)
Admission: EM | Admit: 2021-03-17 | Discharge: 2021-03-17 | Disposition: A | Discharge status: Home / Self Care | Payer: Managed Care, Other (non HMO) | Attending: Emergency Medicine | Admitting: Emergency Medicine

## 2021-03-17 ENCOUNTER — Encounter (HOSPITAL_BASED_OUTPATIENT_CLINIC_OR_DEPARTMENT_OTHER): Payer: Self-pay

## 2021-03-17 ENCOUNTER — Other Ambulatory Visit: Payer: Self-pay

## 2021-03-17 DIAGNOSIS — Z85528 Personal history of other malignant neoplasm of kidney: Secondary | ICD-10-CM | POA: Diagnosis not present

## 2021-03-17 DIAGNOSIS — R102 Pelvic and perineal pain: Secondary | ICD-10-CM | POA: Insufficient documentation

## 2021-03-17 DIAGNOSIS — Z113 Encounter for screening for infections with a predominantly sexual mode of transmission: Secondary | ICD-10-CM | POA: Diagnosis not present

## 2021-03-17 DIAGNOSIS — Z888 Allergy status to other drugs, medicaments and biological substances status: Secondary | ICD-10-CM | POA: Diagnosis not present

## 2021-03-17 DIAGNOSIS — Z886 Allergy status to analgesic agent status: Secondary | ICD-10-CM | POA: Insufficient documentation

## 2021-03-17 DIAGNOSIS — Z79890 Hormone replacement therapy (postmenopausal): Secondary | ICD-10-CM | POA: Insufficient documentation

## 2021-03-17 DIAGNOSIS — Z880 Allergy status to penicillin: Secondary | ICD-10-CM | POA: Diagnosis not present

## 2021-03-17 DIAGNOSIS — Z79899 Other long term (current) drug therapy: Secondary | ICD-10-CM | POA: Diagnosis not present

## 2021-03-17 DIAGNOSIS — Z7951 Long term (current) use of inhaled steroids: Secondary | ICD-10-CM | POA: Diagnosis not present

## 2021-03-17 DIAGNOSIS — E039 Hypothyroidism, unspecified: Secondary | ICD-10-CM | POA: Diagnosis not present

## 2021-03-17 DIAGNOSIS — N938 Other specified abnormal uterine and vaginal bleeding: Secondary | ICD-10-CM | POA: Insufficient documentation

## 2021-03-17 LAB — URINALYSIS, ROUTINE W REFLEX MICROSCOPIC
Bilirubin Urine: NEGATIVE
Glucose, UA: NEGATIVE mg/dL
Ketones, ur: 15 mg/dL — AB
Leukocytes,Ua: NEGATIVE
Nitrite: NEGATIVE
Protein, ur: NEGATIVE mg/dL
Specific Gravity, Urine: 1.014 (ref 1.005–1.030)
pH: 6 (ref 5.0–8.0)

## 2021-03-17 LAB — WET PREP, GENITAL
Clue Cells Wet Prep HPF POC: NONE SEEN
Sperm: NONE SEEN
Trich, Wet Prep: NONE SEEN
Yeast Wet Prep HPF POC: NONE SEEN

## 2021-03-17 LAB — PREGNANCY, URINE: Preg Test, Ur: NEGATIVE

## 2021-03-17 NOTE — ED Provider Notes (Signed)
[facility] EMERGENCY DEPT Provider Note   CSN: [id number] Arrival date & time: 03/17/21  1850     History Chief Complaint  Patient presents with  . Vaginal Prolapse    Kathleen Henry is a 45 y.o. female.  HPI Patient reports over the past couple of days she has had a pressure sensation in her pelvic area.  She feels like there is a pressure in her vagina that made her concerned she might have a uterine prolapse.  She is also has some spotting now for several days.  Patient reports that she had an ablation in 2017 for heavy menstrual bleeding but since that time is really not had any bleeding.  She has not been sexually active in several months.  No fevers no chills.  She is not actually having abdominal pain.    Past Medical History:  Diagnosis Date  . AMA (advanced maternal age) multigravida 59+   . Anemia   . Benign cyst of skin 2014   To neck- removed  . Cancer ([place])    kidney cancer, clear cell  . Family history of colon cancer   . Family history of leukemia   . GERD (gastroesophageal reflux disease)   . History of PCOS   . Hypothyroidism   . Palpitations    occasional   . Personal history of renal cell carcinoma 05/19/2018  . PONV (postoperative nausea and vomiting)    with epidurals had " shakes" afterwards   . Sinus tachycardia   . Yeast infection    hx of in creases of legs per patient, most recent 04/10/2018 patient had to take diflucan x 1 and nystatin cream with clearing.     Patient Active Problem List   Diagnosis Date Noted  . Genetic testing 06/09/2018  . Personal history of renal cell carcinoma 05/19/2018  . Family history of colon cancer   . Family history of leukemia   . Right renal mass 04/17/2018  . Term pregnancy 06/14/2015  . [redacted] weeks gestation of pregnancy   . Evaluate anatomy not seen on prior sonogram   . Maternal morbid obesity, antepartum ([place])   . Hypothyroidism affecting pregnancy in second trimester, antepartum   . [redacted]  weeks gestation of pregnancy   . Advanced maternal age in multigravida   . AMA (advanced maternal age) multigravida 27+   . Encounter for fetal anatomic survey   . Obesity affecting pregnancy in second trimester, antepartum     Past Surgical History:  Procedure Laterality Date  . CESAREAN SECTION    . COLONOSCOPY    . CYSTOSCOPY WITH FULGERATION N/A 04/30/2018   Procedure: CYSTOSCOPY WITH EVACUATION;  Surgeon: Kathie Rhodes, MD;  Location: [facility];  Service: Urology;  Laterality: N/A;  . DILATION AND CURETTAGE OF UTERUS  12/05/2016  . ENDOMETRIAL ABLATION  12/05/2016  . RENAL MASS EXCISION    . ROBOTIC ASSITED PARTIAL NEPHRECTOMY Right 04/17/2018   Procedure: XI ROBOTIC ASSITED PARTIAL NEPHRECTOMY;  Surgeon: Ceasar Mons, MD;  Location: [facility];  Service: Urology;  Laterality: Right;  . TUBAL LIGATION  12/05/2016     OB History    Gravida  3   Para  3   Term  3   Preterm      AB      Living  3     SAB      IAB      Ectopic      Multiple  0   Live Births  3  Family History  Problem Relation Age of Onset  . Hypertension Mother   . Cancer Mother        colon  . CAD Father   . Hypertension Father   . Diabetes Father        Type 2  . Thyroid disease Father   . Kidney disease Father   . Diabetes Daughter        Type 1  . Cancer Paternal Grandmother        liver  . Cancer Paternal Grandfather        leaukemia    Social History   Tobacco Use  . Smoking status: Never Smoker  . Smokeless tobacco: Never Used  Vaping Use  . Vaping Use: Never used  Substance Use Topics  . Alcohol use: No  . Drug use: No    Home Medications Prior to Admission medications   Medication Sig Start Date End Date Taking? Authorizing Provider  acetaminophen (TYLENOL) 500 MG tablet Take 1,000 mg by mouth every 6 (six) hours as needed for moderate pain.    [provider]  albuterol (VENTOLIN HFA) 108 (90 Base) MCG/ACT inhaler INHALE 2 PUFFS INTO  THE LUNGS EVERY 4 HOURS AS NEEDED FOR WHEEZING FOR UP TO 14 DAYS 12/28/20 12/28/21  Zara Chess, NP  Azelastine HCl 137 MCG/SPRAY SOLN PLACE 1 SPRAY INTO BOTH NOSTRILS 2 TIMES DAILY AS DIRECTED 01/12/21 01/12/22  Zara Chess, NP  benzonatate (TESSALON) 100 MG capsule TAKE 1 CAPSULE BY MOUTH 3 TIMES A DAY AS NEEDED FOR COUGH 12/28/20 12/28/21  Zara Chess, NP  busPIRone (BUSPAR) 5 MG tablet TAKE ONE TABLET (5 MG DOSE) BY MOUTH 3 (THREE) TIMES A DAY. 01/09/21 01/09/22  Hite, Asa Lente, FNP  fluconazole (DIFLUCAN) 100 MG tablet Take 150 mg by mouth daily.  04/10/18   [provider]  fluticasone (FLONASE) 50 MCG/ACT nasal spray Place 1 spray into both nostrils daily.     [provider]  Fluticasone-Salmeterol (ADVAIR) 100-50 MCG/DOSE AEPB INHALE 1 PUFF INTO THE LUNGS 2 TIMES DAILY 01/12/21 01/12/22  Zara Chess, NP  levothyroxine (SYNTHROID) 112 MCG tablet TAKE 1 TABLET BY MOUTH ONCE DAILY 12/25/20 12/25/21  Hite, Asa Lente, FNP  levothyroxine (SYNTHROID) 112 MCG tablet TAKE 1 TABLET BY MOUTH ONCE DAILY 09/25/20 09/25/21  Hite, Asa Lente, FNP  levothyroxine (SYNTHROID) 125 MCG tablet TAKE ONE TABLET (125 MCG DOSE) BY MOUTH DAILY AT 6 (SIX) AM. 01/09/21 01/09/22  Hite, Asa Lente, FNP  levothyroxine (SYNTHROID, LEVOTHROID) 125 MCG tablet Take 125 mcg by mouth daily before breakfast.    [provider]  methylPREDNISolone (MEDROL DOSEPAK) 4 MG TBPK tablet TAKE BY MOUTH AS DIRECTED ON PACKAGE 12/28/20 12/28/21  Zara Chess, NP  mirabegron ER (MYRBETRIQ) 50 MG TB24 tablet Take 1 tablet (50 mg total) by mouth daily. 04/30/18   Kathie Rhodes, MD  omeprazole (PRILOSEC) 20 MG capsule Take 20 mg by mouth daily.    [provider]  omeprazole (PRILOSEC) 20 MG capsule TAKE 1 CAPSULE BY MOUTH ONCE A DAY (NEEDS APPT) 02/28/21 02/28/22  Hite, Asa Lente, FNP  omeprazole (PRILOSEC) 20 MG capsule TAKE 1 CAPSULE BY MOUTH ONCE DAILY **NEEDS FOLLOW UP VISIT FOR  FURTHER REFILLS** 02/01/21 02/01/22  Hite, Asa Lente, FNP  omeprazole (PRILOSEC) 20 MG capsule TAKE 1 CAPSULE BY MOUTH ONCE DAILY **NEEDS FOLLOW UP VISIT FOR FURTHER REFILLS** 12/25/20 12/25/21  Hite, Asa Lente, FNP  omeprazole (PRILOSEC) 20 MG capsule TAKE 1 CAPSULE BY MOUTH ONCE DAILY **NEEDS FOLLOW  UP VISIT FOR FURTHER REFILLS** 11/13/20 11/13/21  Hite, Asa Lente, FNP  oxybutynin (DITROPAN) 5 MG tablet Take 1 tablet (5 mg total) by mouth 2 (two) times daily. 04/29/18   Jola Schmidt, MD  Prenatal Vit-Fe Fumarate-FA (PRENATAL MULTIVITAMIN) TABS tablet Take 1 tablet by mouth daily.     [provider]  Probiotic Product (PROBIOTIC DAILY PO) Take 1 capsule by mouth daily.    [provider]  propranolol (INDERAL) 10 MG tablet TAKE 1 TABLET BY MOUTH 4 TIMES DAILY 02/28/21 02/28/22  Zara Chess, NP  propranolol (INDERAL) 10 MG tablet TAKE 1 TABLET BY MOUTH 4 TIMES DAILY 01/12/21 01/12/22  Zara Chess, NP  propranolol (INDERAL) 10 MG tablet TAKE 1 TABLET BY MOUTH TWICE DAILY **NEEDS FOLLOW UP VISIT FOR FURTHER REFILLS** 12/25/20 12/25/21  Hite, Asa Lente, FNP  propranolol (INDERAL) 10 MG tablet TAKE 1 TABLET BY MOUTH TWICE DAILY **NEEDS FOLLOW UP VISIT FOR FURTHER REFILLS** 11/13/20 11/13/21  Hite, Asa Lente, FNP  VITAMIN D, CHOLECALCIFEROL, PO Take 5,000 Units by mouth daily.     [provider]    Allergies    Aspirin, Ibuprofen, Lexapro [escitalopram], and Penicillins  Review of Systems   Review of Systems 10 systems reviewed and negative except as per HPI Physical Exam Updated Vital Signs BP (!) 148/104 (BP Location: Right Arm)   Pulse 82   Temp 98 F (36.7 C) (Oral)   Resp 18   SpO2 99%   Physical Exam Constitutional:      Comments: Patient is alert nontoxic.  Clinically well in appearance  HENT:     Mouth/Throat:     Pharynx: Oropharynx is clear.  Eyes:     Conjunctiva/sclera: Conjunctivae normal.  Pulmonary:     Effort:  Pulmonary effort is normal.  Abdominal:     General: There is no distension.     Palpations: Abdomen is soft.     Tenderness: There is no abdominal tenderness. There is no guarding.  Genitourinary:    Comments: Normal external female genitalia.  Speculum exam cervix has modest amount of brownish blood.  No purulent drainage.  No cervical motion tenderness.  Uterus nontender to palpation. Musculoskeletal:        General: Normal range of motion.  Skin:    General: Skin is warm and dry.  Neurological:     General: No focal deficit present.     Mental Status: She is oriented to person, place, and time.     Coordination: Coordination normal.  Psychiatric:        Mood and Affect: Mood normal.     ED Results / Procedures / Treatments   Labs (all labs ordered are listed, but only abnormal results are displayed) Labs Reviewed  WET PREP, GENITAL - Abnormal; Notable for the following components:      Result Value   WBC, Wet Prep HPF POC MANY (*)    All other components within normal limits  URINALYSIS, ROUTINE W REFLEX MICROSCOPIC - Abnormal; Notable for the following components:   Hgb urine dipstick MODERATE (*)    Ketones, ur 15 (*)    Bacteria, UA RARE (*)    All other components within normal limits  PREGNANCY, URINE  GC/CHLAMYDIA PROBE AMP () NOT AT [facility]    EKG None  Radiology No results found.  Procedures Procedures   Medications Ordered in ED Medications - No data to display  ED Course  I have reviewed the triage vital signs and the nursing notes.  Pertinent labs & imaging results that were available during my care of the patient were reviewed by me and considered in my medical decision making (see chart for details).    MDM Rules/Calculators/A&P                          Patient presents with a feeling of pelvic pressure.  She is not having pain.  She is not sexually active for several months.  She appears to be low risk for STD.  No cervical motion  tenderness.  Patient has had some light spotting which is atypical for her.  pregnancy test is negative.  Patient was concerned by possibility of a prolapse.  physical exam shows no prolapse.  This time patient is stable to follow-up with her GYN for further evaluation and possible pelvic ultrasound or other diagnostic imaging.  Patient had an ablation several years ago and now is having some occasional spotting. Final Clinical Impression(s) / ED Diagnoses Final diagnoses:  Pelvic pain in female  DUB (dysfunctional uterine bleeding)    Rx / DC Orders ED Discharge Orders    None       Charlesetta Shanks, MD 03/17/21 2354

## 2021-03-17 NOTE — Discharge Instructions (Addendum)
1.  Follow-up with your gynecologist as soon as possible for further evaluation. 2.  Your physical exam shows no uterine prolapse.  There are no signs of infection in the urine.  You appear to be low risk for sexually transmitted disease. 3.  Return to the emergency department if you develop worsening pain, fever, lightheadedness or other concerning symptoms

## 2021-03-17 NOTE — ED Triage Notes (Signed)
Pt is present to the ED because she is worried her "cervix or vagina has fallen out" over the last few days with mild spotting, and feels like "something is not right" with her pelvic region. Pt does not describe it as painful but uncomfortable. Hx of an ablation in 2017.

## 2021-03-19 LAB — GC/CHLAMYDIA PROBE AMP (~~LOC~~) NOT AT ARMC
Chlamydia: NEGATIVE
Comment: NEGATIVE
Comment: NORMAL
Neisseria Gonorrhea: NEGATIVE

## 2021-03-24 ENCOUNTER — Other Ambulatory Visit (HOSPITAL_COMMUNITY): Payer: Self-pay

## 2021-04-03 ENCOUNTER — Other Ambulatory Visit (HOSPITAL_COMMUNITY): Payer: Self-pay

## 2021-04-03 MED ORDER — LEVOTHYROXINE SODIUM 125 MCG PO TABS
ORAL_TABLET | ORAL | 2 refills | Status: AC
  Filled 2021-04-03: qty 30, 30d supply, fill #0
  Filled 2021-05-01: qty 30, 30d supply, fill #1
  Filled 2021-06-03: qty 30, 30d supply, fill #2

## 2021-04-03 MED ORDER — PROPRANOLOL HCL 10 MG PO TABS
10.0000 mg | ORAL_TABLET | Freq: Four times a day (QID) | ORAL | 2 refills | Status: DC
  Filled 2021-04-03: qty 120, 30d supply, fill #0
  Filled 2021-06-03: qty 120, 30d supply, fill #1
  Filled 2021-07-30: qty 120, 30d supply, fill #2

## 2021-04-04 ENCOUNTER — Other Ambulatory Visit (HOSPITAL_COMMUNITY): Payer: Self-pay

## 2021-04-04 MED ORDER — OMEPRAZOLE 20 MG PO CPDR
1.0000 | DELAYED_RELEASE_CAPSULE | Freq: Every day | ORAL | 5 refills | Status: AC
  Filled 2021-04-04: qty 30, 30d supply, fill #0
  Filled 2021-05-01: qty 30, 30d supply, fill #1
  Filled 2021-06-03: qty 30, 30d supply, fill #2

## 2021-04-05 ENCOUNTER — Other Ambulatory Visit (HOSPITAL_COMMUNITY): Payer: Self-pay

## 2021-04-06 ENCOUNTER — Other Ambulatory Visit (HOSPITAL_COMMUNITY): Payer: Self-pay

## 2021-04-12 ENCOUNTER — Other Ambulatory Visit (HOSPITAL_COMMUNITY): Payer: Self-pay

## 2021-04-25 ENCOUNTER — Other Ambulatory Visit (HOSPITAL_COMMUNITY): Payer: Self-pay | Admitting: Urology

## 2021-04-25 ENCOUNTER — Ambulatory Visit (HOSPITAL_COMMUNITY)
Admission: RE | Admit: 2021-04-25 | Discharge: 2021-04-25 | Disposition: A | Discharge status: Home / Self Care | Payer: PRIVATE HEALTH INSURANCE | Source: Ambulatory Visit | Attending: Urology | Admitting: Urology

## 2021-04-25 ENCOUNTER — Other Ambulatory Visit: Payer: Self-pay

## 2021-04-25 DIAGNOSIS — C641 Malignant neoplasm of right kidney, except renal pelvis: Secondary | ICD-10-CM

## 2021-05-01 ENCOUNTER — Other Ambulatory Visit (HOSPITAL_COMMUNITY): Payer: Self-pay

## 2021-05-02 ENCOUNTER — Other Ambulatory Visit (HOSPITAL_COMMUNITY): Payer: Self-pay

## 2021-05-03 ENCOUNTER — Other Ambulatory Visit (HOSPITAL_COMMUNITY): Payer: Self-pay

## 2021-06-04 ENCOUNTER — Other Ambulatory Visit (HOSPITAL_COMMUNITY): Payer: Self-pay

## 2021-06-25 ENCOUNTER — Other Ambulatory Visit (HOSPITAL_COMMUNITY): Payer: Self-pay

## 2021-06-25 MED ORDER — FLUTICASONE FUROATE-VILANTEROL 100-25 MCG/INH IN AEPB
INHALATION_SPRAY | RESPIRATORY_TRACT | 0 refills | Status: AC
  Filled 2021-06-25: qty 60, 60d supply, fill #0

## 2021-06-27 ENCOUNTER — Other Ambulatory Visit (HOSPITAL_COMMUNITY): Payer: Self-pay

## 2021-06-27 MED ORDER — OMEPRAZOLE 20 MG PO CPDR
20.0000 mg | DELAYED_RELEASE_CAPSULE | Freq: Every day | ORAL | 3 refills | Status: DC
  Filled 2021-06-27: qty 90, 90d supply, fill #0
  Filled 2021-09-10: qty 90, 90d supply, fill #1
  Filled 2021-11-22: qty 90, 90d supply, fill #2
  Filled 2022-02-27 – 2022-03-13 (×3): qty 90, 90d supply, fill #3

## 2021-06-27 MED ORDER — LEVOTHYROXINE SODIUM 150 MCG PO TABS
ORAL_TABLET | ORAL | 0 refills | Status: DC
  Filled 2021-06-27 – 2021-08-22 (×2): qty 90, 90d supply, fill #0

## 2021-06-27 MED ORDER — LEVOTHYROXINE SODIUM 150 MCG PO TABS
ORAL_TABLET | ORAL | 2 refills | Status: AC
  Filled 2021-06-27: qty 30, 30d supply, fill #0
  Filled 2021-07-30: qty 30, 30d supply, fill #1

## 2021-07-03 ENCOUNTER — Other Ambulatory Visit (HOSPITAL_COMMUNITY): Payer: Self-pay

## 2021-07-05 ENCOUNTER — Other Ambulatory Visit (HOSPITAL_COMMUNITY): Payer: Self-pay

## 2021-07-05 MED ORDER — DULERA 100-5 MCG/ACT IN AERO
INHALATION_SPRAY | RESPIRATORY_TRACT | 0 refills | Status: AC
  Filled 2021-07-05: qty 13, 30d supply, fill #0

## 2021-07-06 ENCOUNTER — Other Ambulatory Visit (HOSPITAL_COMMUNITY): Payer: Self-pay

## 2021-07-10 ENCOUNTER — Other Ambulatory Visit (HOSPITAL_COMMUNITY): Payer: Self-pay

## 2021-07-30 ENCOUNTER — Other Ambulatory Visit (HOSPITAL_COMMUNITY): Payer: Self-pay

## 2021-08-22 ENCOUNTER — Other Ambulatory Visit (HOSPITAL_COMMUNITY): Payer: Self-pay

## 2021-09-10 ENCOUNTER — Other Ambulatory Visit (HOSPITAL_COMMUNITY): Payer: Self-pay

## 2021-09-11 ENCOUNTER — Other Ambulatory Visit (HOSPITAL_COMMUNITY): Payer: Self-pay

## 2021-09-11 MED ORDER — PROPRANOLOL HCL 10 MG PO TABS
10.0000 mg | ORAL_TABLET | Freq: Four times a day (QID) | ORAL | 3 refills | Status: DC
  Filled 2021-09-11: qty 120, 30d supply, fill #0
  Filled 2021-11-22: qty 120, 30d supply, fill #1
  Filled 2022-02-11: qty 120, 30d supply, fill #2
  Filled 2022-05-22: qty 120, 30d supply, fill #3

## 2021-11-22 ENCOUNTER — Other Ambulatory Visit (HOSPITAL_COMMUNITY): Payer: Self-pay

## 2021-11-22 MED ORDER — LEVOTHYROXINE SODIUM 150 MCG PO TABS
ORAL_TABLET | ORAL | 2 refills | Status: DC
  Filled 2021-11-22: qty 90, 90d supply, fill #0
  Filled 2022-02-11: qty 90, 90d supply, fill #1
  Filled 2022-05-14: qty 90, 90d supply, fill #2

## 2021-12-03 ENCOUNTER — Other Ambulatory Visit (HOSPITAL_COMMUNITY): Payer: Self-pay

## 2021-12-03 MED ORDER — METHOCARBAMOL 750 MG PO TABS
ORAL_TABLET | ORAL | 0 refills | Status: AC
  Filled 2021-12-03: qty 42, 24d supply, fill #0

## 2022-02-11 ENCOUNTER — Other Ambulatory Visit (HOSPITAL_COMMUNITY): Payer: Self-pay

## 2022-02-13 ENCOUNTER — Other Ambulatory Visit (HOSPITAL_COMMUNITY): Payer: Self-pay

## 2022-02-13 MED ORDER — FLUCONAZOLE 100 MG PO TABS
ORAL_TABLET | ORAL | 0 refills | Status: AC
  Filled 2022-02-13: qty 14, 14d supply, fill #0

## 2022-02-27 ENCOUNTER — Other Ambulatory Visit (HOSPITAL_COMMUNITY): Payer: Self-pay

## 2022-03-13 ENCOUNTER — Other Ambulatory Visit (HOSPITAL_COMMUNITY): Payer: Self-pay

## 2022-04-03 ENCOUNTER — Other Ambulatory Visit (HOSPITAL_COMMUNITY): Payer: Self-pay

## 2022-04-03 MED ORDER — METHYLPREDNISOLONE 4 MG PO TBPK
ORAL_TABLET | ORAL | 0 refills | Status: AC
  Filled 2022-04-03: qty 21, 7d supply, fill #0

## 2022-05-14 ENCOUNTER — Other Ambulatory Visit (HOSPITAL_COMMUNITY): Payer: Self-pay

## 2022-05-15 ENCOUNTER — Other Ambulatory Visit (HOSPITAL_COMMUNITY): Payer: Self-pay

## 2022-05-22 ENCOUNTER — Other Ambulatory Visit (HOSPITAL_COMMUNITY): Payer: Self-pay

## 2022-05-22 MED ORDER — OMEPRAZOLE 20 MG PO CPDR
20.0000 mg | DELAYED_RELEASE_CAPSULE | Freq: Every day | ORAL | 3 refills | Status: AC
  Filled 2022-05-22: qty 90, 90d supply, fill #0

## 2022-05-24 ENCOUNTER — Other Ambulatory Visit (HOSPITAL_COMMUNITY): Payer: Self-pay

## 2022-05-24 MED ORDER — AZITHROMYCIN 250 MG PO TABS
ORAL_TABLET | ORAL | 0 refills | Status: AC
  Filled 2022-05-24: qty 6, 5d supply, fill #0

## 2022-07-05 ENCOUNTER — Other Ambulatory Visit (HOSPITAL_COMMUNITY): Payer: Self-pay

## 2022-07-05 MED ORDER — OMEPRAZOLE 40 MG PO CPDR
DELAYED_RELEASE_CAPSULE | ORAL | 1 refills | Status: DC
  Filled 2022-07-05: qty 90, 90d supply, fill #0
  Filled 2022-09-19: qty 90, 90d supply, fill #1

## 2022-07-05 MED ORDER — OMEPRAZOLE 20 MG PO CPDR
DELAYED_RELEASE_CAPSULE | ORAL | 3 refills | Status: AC
  Filled 2022-07-05: qty 60, 30d supply, fill #0

## 2022-08-08 ENCOUNTER — Other Ambulatory Visit (HOSPITAL_COMMUNITY): Payer: Self-pay

## 2022-08-08 MED ORDER — LEVOTHYROXINE SODIUM 150 MCG PO TABS
150.0000 ug | ORAL_TABLET | Freq: Every morning | ORAL | 2 refills | Status: AC
  Filled 2022-08-08: qty 90, 90d supply, fill #0
  Filled 2022-11-16: qty 90, 90d supply, fill #1
  Filled 2023-02-11: qty 90, 90d supply, fill #2

## 2022-08-13 ENCOUNTER — Other Ambulatory Visit (HOSPITAL_COMMUNITY): Payer: Self-pay

## 2022-08-16 ENCOUNTER — Other Ambulatory Visit (HOSPITAL_COMMUNITY): Payer: Self-pay

## 2022-09-19 ENCOUNTER — Other Ambulatory Visit (HOSPITAL_COMMUNITY): Payer: Self-pay

## 2022-09-20 ENCOUNTER — Other Ambulatory Visit: Payer: Self-pay

## 2022-09-20 ENCOUNTER — Other Ambulatory Visit (HOSPITAL_COMMUNITY): Payer: Self-pay

## 2022-09-20 MED ORDER — PROPRANOLOL HCL 10 MG PO TABS
10.0000 mg | ORAL_TABLET | Freq: Four times a day (QID) | ORAL | 3 refills | Status: DC
  Filled 2022-09-20: qty 120, 30d supply, fill #0
  Filled 2023-02-03: qty 120, 30d supply, fill #1
  Filled 2023-05-10: qty 120, 30d supply, fill #2
  Filled 2023-07-09: qty 120, 30d supply, fill #3

## 2022-11-17 ENCOUNTER — Other Ambulatory Visit (HOSPITAL_COMMUNITY): Payer: Self-pay

## 2022-11-18 ENCOUNTER — Other Ambulatory Visit (HOSPITAL_COMMUNITY): Payer: Self-pay

## 2022-11-22 ENCOUNTER — Other Ambulatory Visit (HOSPITAL_COMMUNITY): Payer: Self-pay

## 2022-12-08 ENCOUNTER — Other Ambulatory Visit (HOSPITAL_COMMUNITY): Payer: Self-pay

## 2022-12-10 ENCOUNTER — Other Ambulatory Visit: Payer: Self-pay

## 2022-12-10 ENCOUNTER — Other Ambulatory Visit (HOSPITAL_COMMUNITY): Payer: Self-pay

## 2022-12-10 MED ORDER — OMEPRAZOLE 40 MG PO CPDR
40.0000 mg | DELAYED_RELEASE_CAPSULE | Freq: Every day | ORAL | 0 refills | Status: DC
  Filled 2022-12-10 (×2): qty 90, 90d supply, fill #0

## 2023-02-03 ENCOUNTER — Other Ambulatory Visit (HOSPITAL_COMMUNITY): Payer: Self-pay

## 2023-02-11 ENCOUNTER — Other Ambulatory Visit (HOSPITAL_COMMUNITY): Payer: Self-pay

## 2023-02-28 ENCOUNTER — Other Ambulatory Visit: Payer: Self-pay

## 2023-02-28 ENCOUNTER — Other Ambulatory Visit (HOSPITAL_COMMUNITY): Payer: Self-pay

## 2023-02-28 MED ORDER — OMEPRAZOLE 40 MG PO CPDR
40.0000 mg | DELAYED_RELEASE_CAPSULE | Freq: Every day | ORAL | 0 refills | Status: DC
  Filled 2023-02-28: qty 90, 90d supply, fill #0

## 2023-04-16 ENCOUNTER — Other Ambulatory Visit (HOSPITAL_COMMUNITY): Payer: Self-pay

## 2023-04-16 MED ORDER — SCOPOLAMINE 1 MG/3DAYS TD PT72
1.0000 | MEDICATED_PATCH | TRANSDERMAL | 0 refills | Status: AC
  Filled 2023-04-16 (×2): qty 1, 3d supply, fill #0

## 2023-04-16 MED ORDER — OXYCODONE HCL 5 MG PO TABS
5.0000 mg | ORAL_TABLET | Freq: Four times a day (QID) | ORAL | 0 refills | Status: AC | PRN
  Filled 2023-04-16 (×2): qty 20, 5d supply, fill #0

## 2023-04-17 ENCOUNTER — Other Ambulatory Visit (HOSPITAL_COMMUNITY): Payer: Self-pay

## 2023-04-17 MED ORDER — CYCLOBENZAPRINE HCL 5 MG PO TABS
5.0000 mg | ORAL_TABLET | Freq: Three times a day (TID) | ORAL | 0 refills | Status: AC | PRN
  Filled 2023-04-17 (×2): qty 42, 14d supply, fill #0

## 2023-04-21 ENCOUNTER — Other Ambulatory Visit: Payer: Self-pay

## 2023-04-21 ENCOUNTER — Other Ambulatory Visit (HOSPITAL_COMMUNITY): Payer: Self-pay

## 2023-04-21 MED ORDER — LEVOTHYROXINE SODIUM 200 MCG PO TABS
200.0000 ug | ORAL_TABLET | Freq: Every day | ORAL | 1 refills | Status: AC
  Filled 2023-04-21: qty 30, 30d supply, fill #0

## 2023-04-22 ENCOUNTER — Other Ambulatory Visit (HOSPITAL_COMMUNITY): Payer: Self-pay

## 2023-05-08 ENCOUNTER — Other Ambulatory Visit (HOSPITAL_COMMUNITY): Payer: Self-pay

## 2023-05-08 MED ORDER — MIRABEGRON ER 25 MG PO TB24
25.0000 mg | ORAL_TABLET | Freq: Every day | ORAL | 0 refills | Status: AC
  Filled 2023-05-08: qty 90, 90d supply, fill #0

## 2023-05-10 ENCOUNTER — Other Ambulatory Visit (HOSPITAL_COMMUNITY): Payer: Self-pay

## 2023-05-13 ENCOUNTER — Other Ambulatory Visit: Payer: Self-pay

## 2023-05-14 ENCOUNTER — Other Ambulatory Visit (HOSPITAL_COMMUNITY): Payer: Self-pay

## 2023-05-16 ENCOUNTER — Other Ambulatory Visit (HOSPITAL_COMMUNITY): Payer: Self-pay

## 2023-05-19 ENCOUNTER — Other Ambulatory Visit (HOSPITAL_COMMUNITY): Payer: Self-pay

## 2023-05-20 ENCOUNTER — Other Ambulatory Visit: Payer: Self-pay

## 2023-05-20 MED ORDER — LEVOTHYROXINE SODIUM 175 MCG PO TABS: 175.0000 ug | ORAL_TABLET | Freq: Every day | ORAL | 0 refills | Status: DC

## 2023-05-20 MED ORDER — LEVOTHYROXINE SODIUM 150 MCG PO TABS
150.0000 ug | ORAL_TABLET | Freq: Every day | ORAL | 0 refills | Status: DC
  Filled 2023-05-20: qty 90, 90d supply, fill #0

## 2023-05-25 ENCOUNTER — Other Ambulatory Visit (HOSPITAL_COMMUNITY): Payer: Self-pay

## 2023-05-26 ENCOUNTER — Other Ambulatory Visit (HOSPITAL_COMMUNITY): Payer: Self-pay

## 2023-05-26 ENCOUNTER — Other Ambulatory Visit: Payer: Self-pay

## 2023-05-26 MED ORDER — OMEPRAZOLE 40 MG PO CPDR
40.0000 mg | DELAYED_RELEASE_CAPSULE | Freq: Every day | ORAL | 0 refills | Status: DC
  Filled 2023-05-26: qty 90, 90d supply, fill #0

## 2023-07-09 ENCOUNTER — Other Ambulatory Visit (HOSPITAL_COMMUNITY): Payer: Self-pay

## 2023-08-07 ENCOUNTER — Other Ambulatory Visit: Payer: Self-pay

## 2023-08-07 ENCOUNTER — Other Ambulatory Visit (HOSPITAL_COMMUNITY): Payer: Self-pay

## 2023-08-07 MED ORDER — OMEPRAZOLE 40 MG PO CPDR
40.0000 mg | DELAYED_RELEASE_CAPSULE | Freq: Every day | ORAL | 0 refills | Status: DC
Start: 2023-08-07 — End: 2023-10-20
  Filled 2023-08-07: qty 90, 90d supply, fill #0

## 2023-08-07 MED ORDER — LEVOTHYROXINE SODIUM 150 MCG PO TABS
150.0000 ug | ORAL_TABLET | Freq: Every day | ORAL | 0 refills | Status: DC
  Filled 2023-08-07: qty 90, 90d supply, fill #0

## 2023-10-07 ENCOUNTER — Other Ambulatory Visit (HOSPITAL_COMMUNITY): Payer: Self-pay

## 2023-10-07 MED ORDER — MUPIROCIN 2 % EX OINT
TOPICAL_OINTMENT | CUTANEOUS | 0 refills | Status: AC
  Filled 2023-10-07: qty 22, 7d supply, fill #0

## 2023-10-13 ENCOUNTER — Other Ambulatory Visit (HOSPITAL_COMMUNITY): Payer: Self-pay

## 2023-10-19 ENCOUNTER — Other Ambulatory Visit (HOSPITAL_COMMUNITY): Payer: Self-pay

## 2023-10-20 ENCOUNTER — Other Ambulatory Visit: Payer: Self-pay

## 2023-10-20 ENCOUNTER — Other Ambulatory Visit (HOSPITAL_COMMUNITY): Payer: Self-pay

## 2023-10-20 MED ORDER — OMEPRAZOLE 40 MG PO CPDR
40.0000 mg | DELAYED_RELEASE_CAPSULE | Freq: Every day | ORAL | 0 refills | Status: DC
  Filled 2023-10-20: qty 90, 90d supply, fill #0

## 2023-10-23 ENCOUNTER — Other Ambulatory Visit (HOSPITAL_COMMUNITY): Payer: Self-pay

## 2023-11-02 ENCOUNTER — Other Ambulatory Visit (HOSPITAL_COMMUNITY): Payer: Self-pay

## 2023-11-03 ENCOUNTER — Other Ambulatory Visit: Payer: Self-pay

## 2023-11-03 ENCOUNTER — Other Ambulatory Visit (HOSPITAL_COMMUNITY): Payer: Self-pay

## 2023-11-03 MED ORDER — LEVOTHYROXINE SODIUM 150 MCG PO TABS
150.0000 ug | ORAL_TABLET | Freq: Every day | ORAL | 0 refills | Status: DC
  Filled 2023-11-03 (×2): qty 90, 90d supply, fill #0

## 2023-11-03 MED ORDER — PROPRANOLOL HCL 10 MG PO TABS
10.0000 mg | ORAL_TABLET | Freq: Four times a day (QID) | ORAL | 3 refills | Status: DC
  Filled 2023-11-03 (×2): qty 120, 30d supply, fill #0
  Filled 2023-12-28: qty 120, 30d supply, fill #1
  Filled 2024-03-26: qty 120, 30d supply, fill #2
  Filled 2024-06-07: qty 120, 30d supply, fill #3

## 2023-11-07 ENCOUNTER — Other Ambulatory Visit (HOSPITAL_COMMUNITY): Payer: Self-pay

## 2023-12-28 ENCOUNTER — Other Ambulatory Visit (HOSPITAL_COMMUNITY): Payer: Self-pay

## 2023-12-29 ENCOUNTER — Other Ambulatory Visit: Payer: Self-pay

## 2023-12-31 ENCOUNTER — Other Ambulatory Visit (HOSPITAL_COMMUNITY): Payer: Self-pay

## 2023-12-31 ENCOUNTER — Encounter (HOSPITAL_COMMUNITY): Payer: Self-pay

## 2023-12-31 MED ORDER — OMEPRAZOLE 40 MG PO CPDR
40.0000 mg | DELAYED_RELEASE_CAPSULE | Freq: Every day | ORAL | 0 refills | Status: DC
  Filled 2023-12-31: qty 90, 90d supply, fill #0

## 2024-01-12 ENCOUNTER — Other Ambulatory Visit (HOSPITAL_COMMUNITY): Payer: Self-pay

## 2024-01-12 ENCOUNTER — Other Ambulatory Visit: Payer: Self-pay

## 2024-01-12 MED ORDER — ESTRADIOL 0.1 MG/GM VA CREA
1.0000 g | TOPICAL_CREAM | Freq: Every day | VAGINAL | 1 refills | Status: AC
  Filled 2024-01-12: qty 42.5, 90d supply, fill #0

## 2024-02-05 ENCOUNTER — Other Ambulatory Visit (HOSPITAL_COMMUNITY): Payer: Self-pay

## 2024-02-05 MED ORDER — LEVOTHYROXINE SODIUM 150 MCG PO TABS
150.0000 ug | ORAL_TABLET | Freq: Every day | ORAL | 0 refills | Status: DC
Start: 2024-02-05 — End: 2024-04-28
  Filled 2024-02-05: qty 90, 90d supply, fill #0

## 2024-02-27 ENCOUNTER — Other Ambulatory Visit (HOSPITAL_COMMUNITY): Payer: Self-pay

## 2024-03-26 ENCOUNTER — Other Ambulatory Visit (HOSPITAL_COMMUNITY): Payer: Self-pay

## 2024-03-26 ENCOUNTER — Other Ambulatory Visit: Payer: Self-pay

## 2024-03-26 MED ORDER — OMEPRAZOLE 40 MG PO CPDR
40.0000 mg | DELAYED_RELEASE_CAPSULE | Freq: Every day | ORAL | 0 refills | Status: DC
  Filled 2024-03-26: qty 90, 90d supply, fill #0

## 2024-04-28 ENCOUNTER — Other Ambulatory Visit (HOSPITAL_COMMUNITY): Payer: Self-pay

## 2024-04-28 MED ORDER — LEVOTHYROXINE SODIUM 150 MCG PO TABS
150.0000 ug | ORAL_TABLET | Freq: Every day | ORAL | 0 refills | Status: DC
  Filled 2024-04-28: qty 90, 90d supply, fill #0

## 2024-04-29 ENCOUNTER — Other Ambulatory Visit: Payer: Self-pay

## 2024-04-29 ENCOUNTER — Other Ambulatory Visit (HOSPITAL_COMMUNITY): Payer: Self-pay

## 2024-04-29 ENCOUNTER — Encounter: Payer: Self-pay | Admitting: Pharmacist

## 2024-05-04 ENCOUNTER — Other Ambulatory Visit: Payer: Self-pay

## 2024-06-07 ENCOUNTER — Other Ambulatory Visit (HOSPITAL_COMMUNITY): Payer: Self-pay

## 2024-06-08 ENCOUNTER — Other Ambulatory Visit (HOSPITAL_COMMUNITY): Payer: Self-pay

## 2024-06-08 ENCOUNTER — Other Ambulatory Visit: Payer: Self-pay

## 2024-06-08 MED ORDER — OMEPRAZOLE 40 MG PO CPDR
40.0000 mg | DELAYED_RELEASE_CAPSULE | Freq: Every day | ORAL | 0 refills | Status: DC
  Filled 2024-06-09 – 2024-06-21 (×2): qty 90, 90d supply, fill #0

## 2024-06-09 ENCOUNTER — Other Ambulatory Visit (HOSPITAL_COMMUNITY): Payer: Self-pay

## 2024-06-10 ENCOUNTER — Other Ambulatory Visit (HOSPITAL_COMMUNITY): Payer: Self-pay

## 2024-06-10 MED ORDER — KETOCONAZOLE 2 % EX CREA
1.0000 | TOPICAL_CREAM | Freq: Every day | CUTANEOUS | 0 refills | Status: AC
  Filled 2024-06-10: qty 15, 15d supply, fill #0

## 2024-06-10 MED ORDER — FLUCONAZOLE 200 MG PO TABS
200.0000 mg | ORAL_TABLET | Freq: Every day | ORAL | 0 refills | Status: AC
  Filled 2024-06-10: qty 7, 7d supply, fill #0

## 2024-06-11 ENCOUNTER — Other Ambulatory Visit: Payer: Self-pay

## 2024-06-11 ENCOUNTER — Other Ambulatory Visit (HOSPITAL_COMMUNITY): Payer: Self-pay

## 2024-06-11 MED ORDER — NYSTATIN 100000 UNIT/GM EX POWD
1.0000 | Freq: Three times a day (TID) | CUTANEOUS | 0 refills | Status: DC
  Filled 2024-06-11: qty 60, 20d supply, fill #0

## 2024-06-11 MED ORDER — KETOCONAZOLE 2 % EX SHAM
1.0000 | MEDICATED_SHAMPOO | CUTANEOUS | 0 refills | Status: DC | PRN
  Filled 2024-06-11: qty 120, 30d supply, fill #0

## 2024-06-21 ENCOUNTER — Other Ambulatory Visit: Payer: Self-pay

## 2024-06-21 ENCOUNTER — Other Ambulatory Visit (HOSPITAL_COMMUNITY): Payer: Self-pay

## 2024-07-27 ENCOUNTER — Other Ambulatory Visit (HOSPITAL_COMMUNITY): Payer: Self-pay

## 2024-07-27 MED ORDER — KETOCONAZOLE 2 % EX SHAM
MEDICATED_SHAMPOO | CUTANEOUS | 0 refills | Status: AC
  Filled 2024-07-27: qty 120, 30d supply, fill #0

## 2024-07-27 MED ORDER — LEVOTHYROXINE SODIUM 150 MCG PO TABS
150.0000 ug | ORAL_TABLET | Freq: Every day | ORAL | 0 refills | Status: DC
  Filled 2024-07-27: qty 90, 90d supply, fill #0

## 2024-07-27 MED ORDER — PROPRANOLOL HCL 10 MG PO TABS
10.0000 mg | ORAL_TABLET | Freq: Four times a day (QID) | ORAL | 3 refills | Status: AC
  Filled 2024-07-27: qty 120, 30d supply, fill #0
  Filled 2024-08-30: qty 120, 30d supply, fill #1
  Filled 2024-10-25: qty 120, 30d supply, fill #2

## 2024-08-30 ENCOUNTER — Other Ambulatory Visit (HOSPITAL_COMMUNITY): Payer: Self-pay

## 2024-09-07 ENCOUNTER — Other Ambulatory Visit (HOSPITAL_COMMUNITY): Payer: Self-pay

## 2024-09-08 ENCOUNTER — Other Ambulatory Visit: Payer: Self-pay

## 2024-09-08 ENCOUNTER — Other Ambulatory Visit (HOSPITAL_COMMUNITY): Payer: Self-pay

## 2024-09-08 MED ORDER — OMEPRAZOLE 40 MG PO CPDR
40.0000 mg | DELAYED_RELEASE_CAPSULE | Freq: Every day | ORAL | 0 refills | Status: DC
  Filled 2024-09-08: qty 90, 90d supply, fill #0

## 2024-09-08 MED ORDER — NYSTATIN 100000 UNIT/GM EX POWD
1.0000 | Freq: Three times a day (TID) | CUTANEOUS | 0 refills | Status: AC
  Filled 2024-09-08: qty 60, 20d supply, fill #0

## 2024-09-15 ENCOUNTER — Other Ambulatory Visit (HOSPITAL_COMMUNITY): Payer: Self-pay

## 2024-10-08 ENCOUNTER — Ambulatory Visit
Admission: RE | Admit: 2024-10-08 | Discharge: 2024-10-08 | Disposition: A | Discharge status: Home / Self Care | Source: Ambulatory Visit | Attending: Family Medicine | Admitting: Family Medicine

## 2024-10-08 VITALS — BP 157/103 | HR 93 | Temp 97.9°F | Resp 20

## 2024-10-08 DIAGNOSIS — K645 Perianal venous thrombosis: Secondary | ICD-10-CM

## 2024-10-08 MED ORDER — HYDROCORTISONE ACETATE 25 MG RE SUPP: 25.0000 mg | Freq: Two times a day (BID) | RECTAL | 0 refills | Status: AC

## 2024-10-08 NOTE — ED Triage Notes (Signed)
 Pt c/o hemorrhoids/bleeding started last night and worse this am-NAD-steady gait

## 2024-10-08 NOTE — ED Notes (Signed)
 In with Christopher RIGGERS for exam

## 2024-10-08 NOTE — Discharge Instructions (Signed)
 Start using a hydrocortisone suppository to help with your external bleeding hemorrhoid.  It has ruptured and therefore I expect a good trajectory.  However, it would be a good idea to follow-up with your GI doctor or your OB/GYN.  You you can use sitz bath's or over-the-counter lidocaine  gel for relief in the meantime in addition to the suppositories.

## 2024-10-08 NOTE — ED Provider Notes (Signed)
 [facility]  Note:  This document was prepared using Conservation officer, historic buildings and may include unintentional dictation errors.  MRN: [id number] DOB: [birth year]  Subjective:   Kathleen Henry is a 48 y.o. female presenting for acute onset of bright red blood per rectum and in the commode.  Patient has a history of hemorrhoids.  She has battled them intermittently for years.  However, she feels that this has been more bleeding than what she is used to.  Has previously responded well using hydrocortisone suppositories.  She does have a history of IBS.  She does have both GI and OB/GYN that has previously discussed this with her.  No fever, recent antibiotic use, hospitalizations or long distance travel.  Has not eaten raw foods, drank unfiltered water.  No history of Crohn's, ulcerative colitis.   No current facility-administered medications for this encounter.  Current Outpatient Medications:    acetaminophen  (TYLENOL ) 500 MG tablet, Take 1,000 mg by mouth every 6 (six) hours as needed for moderate pain., Disp: , Rfl:    albuterol (VENTOLIN HFA) 108 (90 Base) MCG/ACT inhaler, INHALE 2 PUFFS INTO THE LUNGS EVERY 4 HOURS AS NEEDED FOR WHEEZING FOR UP TO 14 DAYS, Disp: 18 g, Rfl: 0   Azelastine HCl 137 MCG/SPRAY SOLN, PLACE 1 SPRAY INTO BOTH NOSTRILS 2 TIMES DAILY AS DIRECTED, Disp: 30 mL, Rfl: 12   azithromycin  (ZITHROMAX ) 250 MG tablet, Take 2 tablets by mouth on day 1, then 1 tablet daily on days 2-5, Disp: 6 tablet, Rfl: 0   cyclobenzaprine  (FLEXERIL ) 5 MG tablet, Take 1 tablet (5 mg total) by mouth 3 (three) times daily as needed for muscle spasms., Disp: 42 tablet, Rfl: 0   estradiol  (ESTRACE ) 0.1 MG/GM vaginal cream, Place one gram vaginally at bedtime for 14 days, THEN one gram 2 (two) times a week., Disp: 42.5 g, Rfl: 1   fluconazole  (DIFLUCAN ) 100 MG tablet, Take 150 mg by mouth daily. , Disp: , Rfl:    fluconazole  (DIFLUCAN ) 100 MG tablet, Take one tablet  (100 mg dose) by mouth daily for 14 days., Disp: 14 tablet, Rfl: 0   fluconazole  (DIFLUCAN ) 200 MG tablet, Take one tablet (200 mg dose) by mouth daily for 7 days., Disp: 7 tablet, Rfl: 0   fluticasone  (FLONASE ) 50 MCG/ACT nasal spray, Place 1 spray into both nostrils daily., Disp: , Rfl:    fluticasone  furoate-vilanterol (BREO ELLIPTA ) 100-25 MCG/INH AEPB, Inhale one puff into the lungs daily., Disp: 60 each, Rfl: 0   Fluticasone -Salmeterol (ADVAIR) 100-50 MCG/DOSE AEPB, INHALE 1 PUFF INTO THE LUNGS 2 TIMES DAILY, Disp: 60 each, Rfl: 0   ketoconazole  (NIZORAL ) 2 % cream, Apply topically daily., Disp: 15 g, Rfl: 0   ketoconazole  (NIZORAL ) 2 % shampoo, Apply topically as needed., Disp: 120 mL, Rfl: 0   levothyroxine  (SYNTHROID ) 112 MCG tablet, TAKE 1 TABLET BY MOUTH ONCE DAILY, Disp: 30 tablet, Rfl: 0   levothyroxine  (SYNTHROID ) 112 MCG tablet, TAKE 1 TABLET BY MOUTH ONCE DAILY, Disp: 30 tablet, Rfl: 2   levothyroxine  (SYNTHROID ) 125 MCG tablet, TAKE ONE TABLET (125 MCG DOSE) BY MOUTH DAILY AT 6 (SIX) AM., Disp: 30 tablet, Rfl: 1   levothyroxine  (SYNTHROID ) 125 MCG tablet, Take one tablet (125 mcg dose) by mouth daily at 6 (six) am., Disp: 30 tablet, Rfl: 2   levothyroxine  (SYNTHROID ) 150 MCG tablet, Take one tablet (150 mcg dose) by mouth daily at 6 (six) am., Disp: 30 tablet, Rfl: 2   levothyroxine  (  SYNTHROID ) 150 MCG tablet, Take 1 tablet by mouth every morning., Disp: 90 tablet, Rfl: 2   levothyroxine  (SYNTHROID ) 150 MCG tablet, Take 1 tablet (150 mcg total) by mouth daily., Disp: 90 tablet, Rfl: 0   levothyroxine  (SYNTHROID ) 200 MCG tablet, Take 1 tablet (200 mcg total) by mouth daily at 6 (six) AM., Disp: 30 tablet, Rfl: 1   levothyroxine  (SYNTHROID , LEVOTHROID) 125 MCG tablet, Take 125 mcg by mouth daily before breakfast., Disp: , Rfl:    methocarbamol  (ROBAXIN ) 750 MG tablet, Take one tablet (750 mg dose) by mouth 3 (three) times a day., Disp: 42 tablet, Rfl: 0   methylPREDNISolone  (MEDROL   DOSEPAK) 4 MG TBPK tablet, Take 1 tablet (4 mg dose) by mouth daily. follow package directions, Disp: 21 tablet, Rfl: 0   mirabegron  ER (MYRBETRIQ ) 25 MG TB24 tablet, Take 1 tablet (25 mg total) by mouth daily., Disp: 90 tablet, Rfl: 0   mirabegron  ER (MYRBETRIQ ) 50 MG TB24 tablet, Take 1 tablet (50 mg total) by mouth daily., Disp: 10 tablet, Rfl: 0   mometasone -formoterol  (DULERA ) 100-5 MCG/ACT AERO, Inhale 2 puffs into the lungs 2 times daily., Disp: 60 g, Rfl: 0   mupirocin  ointment (BACTROBAN ) 2 %, Apply topically 2 (two) times daily for 7 days., Disp: 22 g, Rfl: 0   nystatin  powder, Apply 1 Application topically 3 (three) times daily., Disp: 60 g, Rfl: 0   omeprazole  (PRILOSEC) 20 MG capsule, Take 20 mg by mouth daily., Disp: , Rfl:    omeprazole  (PRILOSEC) 20 MG capsule, TAKE 1 CAPSULE BY MOUTH ONCE A DAY (NEEDS APPT), Disp: 30 capsule, Rfl: 0   omeprazole  (PRILOSEC) 20 MG capsule, TAKE 1 CAPSULE BY MOUTH ONCE DAILY **NEEDS FOLLOW UP VISIT FOR FURTHER REFILLS**, Disp: 30 capsule, Rfl: 0   omeprazole  (PRILOSEC) 20 MG capsule, TAKE 1 CAPSULE BY MOUTH ONCE DAILY **NEEDS FOLLOW UP VISIT FOR FURTHER REFILLS**, Disp: 30 capsule, Rfl: 0   omeprazole  (PRILOSEC) 20 MG capsule, TAKE 1 CAPSULE BY MOUTH ONCE DAILY **NEEDS FOLLOW UP VISIT FOR FURTHER REFILLS**, Disp: 30 capsule, Rfl: 0   omeprazole  (PRILOSEC) 20 MG capsule, TAKE 1 CAPSULE BY MOUTH ONCE DAILY, Disp: 30 capsule, Rfl: 5   omeprazole  (PRILOSEC) 20 MG capsule, TAKE 1 CAPSULE BY MOUTH ONCE DAILY, Disp: 90 capsule, Rfl: 3   omeprazole  (PRILOSEC) 20 MG capsule, Take two capsules (40 mg dose) by mouth daily., Disp: 90 capsule, Rfl: 3   omeprazole  (PRILOSEC) 40 MG capsule, Take 1 capsule (40 mg total) by mouth daily., Disp: 90 capsule, Rfl: 0   oxybutynin  (DITROPAN ) 5 MG tablet, Take 1 tablet (5 mg total) by mouth 2 (two) times daily., Disp: 10 tablet, Rfl: 0   oxyCODONE  (OXY IR/ROXICODONE ) 5 MG immediate release tablet, Take 1 tablet (5 mg total) by  mouth every 6 (six) hours as needed, for pain. Take up to 5 days. Max daily amount: 4 tablets, Disp: 20 tablet, Rfl: 0   Prenatal Vit-Fe Fumarate-FA (PRENATAL MULTIVITAMIN) TABS tablet, Take 1 tablet by mouth daily. , Disp: , Rfl:    Probiotic Product (PROBIOTIC DAILY PO), Take 1 capsule by mouth daily., Disp: , Rfl:    propranolol  (INDERAL ) 10 MG tablet, TAKE 1 TABLET BY MOUTH 4 TIMES DAILY, Disp: 120 tablet, Rfl: 0   propranolol  (INDERAL ) 10 MG tablet, TAKE 1 TABLET BY MOUTH 4 TIMES DAILY, Disp: 120 tablet, Rfl: 0   propranolol  (INDERAL ) 10 MG tablet, TAKE 1 TABLET BY MOUTH TWICE DAILY **NEEDS FOLLOW UP VISIT FOR FURTHER REFILLS**,  Disp: 60 tablet, Rfl: 0   propranolol  (INDERAL ) 10 MG tablet, TAKE 1 TABLET BY MOUTH TWICE DAILY **NEEDS FOLLOW UP VISIT FOR FURTHER REFILLS**, Disp: 60 tablet, Rfl: 0   propranolol  (INDERAL ) 10 MG tablet, Take 1 tablet (10 mg total) by mouth 4 (four) times daily., Disp: 120 tablet, Rfl: 3   scopolamine  (TRANSDERM-SCOP) 1 MG/3DAYS, Place 1 patch (1.5 mg total) onto the skin as directed (the night before surgery)., Disp: 1 patch, Rfl: 0   VITAMIN D, CHOLECALCIFEROL, PO, Take 5,000 Units by mouth daily. , Disp: , Rfl:    Allergies  Allergen Reactions   Aspirin  Hives   Ibuprofen      Causes gi bleeding issues    Lexapro [Escitalopram] Other (See Comments)    Hot/cold flashes    Penicillins Rash    Has patient had a PCN reaction causing immediate rash, facial/tongue/throat swelling, SOB or lightheadedness with hypotension: no Has patient had a PCN reaction causing severe rash involving mucus membranes or skin necrosis: no Has patient had a PCN reaction that required hospitalization: no Has patient had a PCN reaction occurring within the last 10 years: no If all of the above answers are NO, then may proceed with Cephalosporin use.     Past Medical History:  Diagnosis Date   AMA (advanced maternal age) multigravida 35+    Anemia    Benign cyst of skin 2014    To neck- removed   Cancer (HCC)    kidney cancer, clear cell   Family history of colon cancer    Family history of leukemia    GERD (gastroesophageal reflux disease)    History of PCOS    Hypothyroidism    Palpitations    occasional    Personal history of renal cell carcinoma 05/19/2018   PONV (postoperative nausea and vomiting)    with epidurals had  shakes afterwards    Sinus tachycardia    Yeast infection    hx of in creases of legs per patient, most recent 04/10/2018 patient had to take diflucan  x 1 and nystatin  cream with clearing.      Past Surgical History:  Procedure Laterality Date   ABDOMINAL HYSTERECTOMY     CESAREAN SECTION     COLONOSCOPY     CYSTOSCOPY WITH FULGERATION N/A 04/30/2018   Procedure: CYSTOSCOPY WITH EVACUATION;  Surgeon: Ottelin, Mark, MD;  Location: [facility];  Service: Urology;  Laterality: N/A;   DILATION AND CURETTAGE OF UTERUS  12/05/2016   ENDOMETRIAL ABLATION  12/05/2016   RENAL MASS EXCISION     ROBOTIC ASSITED PARTIAL NEPHRECTOMY Right 04/17/2018   Procedure: XI ROBOTIC ASSITED PARTIAL NEPHRECTOMY;  Surgeon: Devere Lonni Righter, MD;  Location: [facility];  Service: Urology;  Laterality: Right;   TUBAL LIGATION  12/05/2016    Family History  Problem Relation Age of Onset   Hypertension Mother    Cancer Mother        colon   CAD Father    Hypertension Father    Diabetes Father        Type 2   Thyroid  disease Father    Kidney disease Father    Diabetes Daughter        Type 1   Cancer Paternal Grandmother        liver   Cancer Paternal Grandfather        leaukemia    Social History   Tobacco Use   Smoking status: Never   Smokeless tobacco: Never  Vaping Use  Vaping status: Never Used  Substance Use Topics   Alcohol use: No   Drug use: No    ROS   Objective:   Vitals: BP (!) 157/103 (BP Location: Right Arm)   Pulse 93   Temp 97.9 F (36.6 C) (Oral)   Resp 20   LMP 04/12/2020   SpO2 98%   Physical Exam Exam  conducted with a chaperone present Restaurant manager, [employer]).  Constitutional:      General: She is not in acute distress.    Appearance: Normal appearance. She is well-developed. She is not ill-appearing, toxic-appearing or diaphoretic.  HENT:     Head: Normocephalic and atraumatic.     Nose: Nose normal.     Mouth/Throat:     Mouth: Mucous membranes are moist.  Eyes:     General: No scleral icterus.       Right eye: No discharge.        Left eye: No discharge.     Extraocular Movements: Extraocular movements intact.  Cardiovascular:     Rate and Rhythm: Normal rate.  Pulmonary:     Effort: Pulmonary effort is normal.  Genitourinary:  Skin:    General: Skin is warm and dry.  Neurological:     General: No focal deficit present.     Mental Status: She is alert and oriented to person, place, and time.  Psychiatric:        Mood and Affect: Mood normal.        Behavior: Behavior normal.     Assessment and Plan :   PDMP not reviewed this encounter.  1. Thrombosed external hemorrhoid    Patient is to try hydrocortisone suppository.  General management of thrombosed external hemorrhoids reviewed.  Recommend follow-up with GI doctor or OB/GYN.  Counseled patient on potential for adverse effects with medications prescribed/recommended today, ER and return-to-clinic precautions discussed, patient verbalized understanding.    Christopher Savannah, [place] 10/08/24 [phone]

## 2024-10-19 ENCOUNTER — Other Ambulatory Visit (HOSPITAL_COMMUNITY): Payer: Self-pay

## 2024-10-19 ENCOUNTER — Other Ambulatory Visit: Payer: Self-pay

## 2024-10-19 MED ORDER — DICYCLOMINE HCL 20 MG PO TABS: 20.0000 mg | ORAL_TABLET | Freq: Three times a day (TID) | ORAL | 2 refills | Status: AC

## 2024-10-25 ENCOUNTER — Other Ambulatory Visit: Payer: Self-pay

## 2024-10-25 ENCOUNTER — Other Ambulatory Visit (HOSPITAL_COMMUNITY): Payer: Self-pay

## 2024-10-25 MED ORDER — LEVOTHYROXINE SODIUM 150 MCG PO TABS
150.0000 ug | ORAL_TABLET | Freq: Every day | ORAL | 0 refills | Status: AC
  Filled 2024-10-25: qty 90, 90d supply, fill #0

## 2024-11-30 ENCOUNTER — Other Ambulatory Visit (HOSPITAL_COMMUNITY): Payer: Self-pay

## 2024-12-01 ENCOUNTER — Other Ambulatory Visit: Payer: Self-pay

## 2024-12-01 ENCOUNTER — Other Ambulatory Visit (HOSPITAL_COMMUNITY): Payer: Self-pay

## 2024-12-01 MED ORDER — OMEPRAZOLE 40 MG PO CPDR
40.0000 mg | DELAYED_RELEASE_CAPSULE | Freq: Every day | ORAL | 0 refills | Status: AC
  Filled 2024-12-01: qty 90, 90d supply, fill #0

## 2025-01-21 ENCOUNTER — Other Ambulatory Visit (HOSPITAL_COMMUNITY): Payer: Self-pay
